# Patient Record
Sex: Male | Born: 1935 | Race: White | Hispanic: No | Marital: Married | Smoking: Former smoker
Health system: Southern US, Community
[De-identification: ages and names within clinical notes are randomized; demographics above are authoritative.]

## PROBLEM LIST (undated history)

## (undated) DIAGNOSIS — R569 Unspecified convulsions: Secondary | ICD-10-CM

## (undated) DIAGNOSIS — C801 Malignant (primary) neoplasm, unspecified: Secondary | ICD-10-CM

## (undated) DIAGNOSIS — M5136 Other intervertebral disc degeneration, lumbar region: Secondary | ICD-10-CM

## (undated) DIAGNOSIS — H3530 Unspecified macular degeneration: Secondary | ICD-10-CM

## (undated) DIAGNOSIS — I10 Essential (primary) hypertension: Secondary | ICD-10-CM

## (undated) DIAGNOSIS — M51369 Other intervertebral disc degeneration, lumbar region without mention of lumbar back pain or lower extremity pain: Secondary | ICD-10-CM

## (undated) HISTORY — PX: EYE SURGERY: SHX253

## (undated) HISTORY — PX: OTHER SURGICAL HISTORY: SHX169

## (undated) HISTORY — PX: COLOSTOMY: SHX63

## (undated) HISTORY — PX: TONSILLECTOMY: SUR1361

## (undated) HISTORY — PX: COLON SURGERY: SHX602

---

## 1997-11-26 ENCOUNTER — Other Ambulatory Visit: Admission: RE | Admit: 1997-11-26 | Discharge: 1997-11-26 | Payer: Self-pay | Admitting: Urology

## 1998-03-22 ENCOUNTER — Ambulatory Visit (HOSPITAL_COMMUNITY): Admission: RE | Admit: 1998-03-22 | Discharge: 1998-03-22 | Payer: Self-pay | Admitting: Gastroenterology

## 1998-11-25 ENCOUNTER — Other Ambulatory Visit: Admission: RE | Admit: 1998-11-25 | Discharge: 1998-11-25 | Payer: Self-pay | Admitting: Urology

## 1998-12-20 ENCOUNTER — Encounter: Admission: RE | Admit: 1998-12-20 | Discharge: 1999-03-20 | Payer: Self-pay | Admitting: Radiation Oncology

## 1998-12-31 ENCOUNTER — Encounter: Admission: RE | Admit: 1998-12-31 | Discharge: 1999-03-31 | Payer: Self-pay | Admitting: Radiation Oncology

## 1999-03-07 ENCOUNTER — Ambulatory Visit (HOSPITAL_BASED_OUTPATIENT_CLINIC_OR_DEPARTMENT_OTHER): Admission: RE | Admit: 1999-03-07 | Discharge: 1999-03-07 | Payer: Self-pay | Admitting: Urology

## 1999-03-07 ENCOUNTER — Encounter: Payer: Self-pay | Admitting: Urology

## 1999-03-08 ENCOUNTER — Emergency Department (HOSPITAL_COMMUNITY): Admission: EM | Admit: 1999-03-08 | Discharge: 1999-03-08 | Payer: Self-pay | Admitting: Emergency Medicine

## 1999-03-10 ENCOUNTER — Emergency Department (HOSPITAL_COMMUNITY): Admission: EM | Admit: 1999-03-10 | Discharge: 1999-03-10 | Payer: Self-pay | Admitting: Emergency Medicine

## 1999-03-12 ENCOUNTER — Emergency Department (HOSPITAL_COMMUNITY): Admission: EM | Admit: 1999-03-12 | Discharge: 1999-03-12 | Payer: Self-pay | Admitting: Emergency Medicine

## 1999-04-04 ENCOUNTER — Ambulatory Visit (HOSPITAL_COMMUNITY): Admission: RE | Admit: 1999-04-04 | Discharge: 1999-04-04 | Payer: Self-pay | Admitting: Radiation Oncology

## 1999-04-04 ENCOUNTER — Encounter: Payer: Self-pay | Admitting: Radiation Oncology

## 2000-06-01 HISTORY — PX: INSERTION PROSTATE RADIATION SEED: SUR718

## 2000-08-12 ENCOUNTER — Ambulatory Visit (HOSPITAL_COMMUNITY): Admission: RE | Admit: 2000-08-12 | Discharge: 2000-08-12 | Payer: Self-pay | Admitting: Gastroenterology

## 2000-09-02 ENCOUNTER — Ambulatory Visit (HOSPITAL_COMMUNITY): Admission: RE | Admit: 2000-09-02 | Discharge: 2000-09-02 | Payer: Self-pay | Admitting: Gastroenterology

## 2000-09-02 ENCOUNTER — Encounter: Payer: Self-pay | Admitting: Gastroenterology

## 2000-09-09 ENCOUNTER — Encounter: Payer: Self-pay | Admitting: Gastroenterology

## 2000-09-09 ENCOUNTER — Ambulatory Visit (HOSPITAL_COMMUNITY): Admission: RE | Admit: 2000-09-09 | Discharge: 2000-09-09 | Payer: Self-pay | Admitting: Gastroenterology

## 2000-09-20 ENCOUNTER — Ambulatory Visit: Admission: RE | Admit: 2000-09-20 | Discharge: 2000-12-19 | Payer: Self-pay | Admitting: Urology

## 2000-10-25 ENCOUNTER — Encounter: Payer: Self-pay | Admitting: Emergency Medicine

## 2000-10-25 ENCOUNTER — Emergency Department (HOSPITAL_COMMUNITY): Admission: EM | Admit: 2000-10-25 | Discharge: 2000-10-25 | Payer: Self-pay | Admitting: Emergency Medicine

## 2000-11-03 ENCOUNTER — Ambulatory Visit (HOSPITAL_COMMUNITY): Admission: RE | Admit: 2000-11-03 | Discharge: 2000-11-03 | Payer: Self-pay | Admitting: Gastroenterology

## 2000-11-03 ENCOUNTER — Encounter: Payer: Self-pay | Admitting: Gastroenterology

## 2000-11-05 ENCOUNTER — Emergency Department (HOSPITAL_COMMUNITY): Admission: EM | Admit: 2000-11-05 | Discharge: 2000-11-05 | Payer: Self-pay | Admitting: Emergency Medicine

## 2000-11-27 ENCOUNTER — Emergency Department (HOSPITAL_COMMUNITY): Admission: EM | Admit: 2000-11-27 | Discharge: 2000-11-27 | Payer: Self-pay | Admitting: Emergency Medicine

## 2000-12-07 ENCOUNTER — Encounter: Payer: Self-pay | Admitting: Gastroenterology

## 2000-12-07 ENCOUNTER — Ambulatory Visit (HOSPITAL_COMMUNITY): Admission: RE | Admit: 2000-12-07 | Discharge: 2000-12-07 | Payer: Self-pay | Admitting: Gastroenterology

## 2000-12-10 ENCOUNTER — Ambulatory Visit (HOSPITAL_COMMUNITY): Admission: RE | Admit: 2000-12-10 | Discharge: 2000-12-10 | Payer: Self-pay | Admitting: Gastroenterology

## 2000-12-10 ENCOUNTER — Encounter (INDEPENDENT_AMBULATORY_CARE_PROVIDER_SITE_OTHER): Payer: Self-pay | Admitting: Specialist

## 2000-12-31 ENCOUNTER — Ambulatory Visit (HOSPITAL_COMMUNITY): Admission: RE | Admit: 2000-12-31 | Discharge: 2000-12-31 | Payer: Self-pay | Admitting: Radiation Oncology

## 2001-01-03 ENCOUNTER — Ambulatory Visit (HOSPITAL_COMMUNITY): Admission: RE | Admit: 2001-01-03 | Discharge: 2001-01-03 | Payer: Self-pay | Admitting: Pulmonary Disease

## 2001-01-03 ENCOUNTER — Encounter: Payer: Self-pay | Admitting: Pulmonary Disease

## 2001-01-04 ENCOUNTER — Ambulatory Visit (HOSPITAL_COMMUNITY): Admission: RE | Admit: 2001-01-04 | Discharge: 2001-01-04 | Payer: Self-pay | Admitting: Radiation Oncology

## 2001-01-04 ENCOUNTER — Encounter: Payer: Self-pay | Admitting: Radiation Oncology

## 2001-03-09 ENCOUNTER — Ambulatory Visit (HOSPITAL_COMMUNITY): Admission: RE | Admit: 2001-03-09 | Discharge: 2001-03-09 | Payer: Self-pay | Admitting: Gastroenterology

## 2001-03-30 ENCOUNTER — Inpatient Hospital Stay (HOSPITAL_COMMUNITY): Admission: RE | Admit: 2001-03-30 | Discharge: 2001-04-04 | Payer: Self-pay | Admitting: General Surgery

## 2002-02-20 ENCOUNTER — Encounter: Payer: Self-pay | Admitting: Family Medicine

## 2002-02-20 ENCOUNTER — Encounter: Admission: RE | Admit: 2002-02-20 | Discharge: 2002-02-20 | Payer: Self-pay | Admitting: Family Medicine

## 2002-06-12 ENCOUNTER — Ambulatory Visit (HOSPITAL_COMMUNITY): Admission: RE | Admit: 2002-06-12 | Discharge: 2002-06-12 | Payer: Self-pay | Admitting: Gastroenterology

## 2003-01-04 ENCOUNTER — Ambulatory Visit (HOSPITAL_COMMUNITY): Admission: RE | Admit: 2003-01-04 | Discharge: 2003-01-04 | Payer: Self-pay | Admitting: Urology

## 2003-02-14 ENCOUNTER — Ambulatory Visit (HOSPITAL_COMMUNITY): Admission: RE | Admit: 2003-02-14 | Discharge: 2003-02-14 | Payer: Self-pay | Admitting: Gastroenterology

## 2003-04-05 ENCOUNTER — Encounter: Admission: RE | Admit: 2003-04-05 | Discharge: 2003-04-05 | Payer: Self-pay | Admitting: Family Medicine

## 2004-07-09 ENCOUNTER — Ambulatory Visit (HOSPITAL_COMMUNITY): Admission: RE | Admit: 2004-07-09 | Discharge: 2004-07-09 | Payer: Self-pay | Admitting: Urology

## 2007-06-02 HISTORY — PX: JOINT REPLACEMENT: SHX530

## 2007-10-10 ENCOUNTER — Inpatient Hospital Stay (HOSPITAL_COMMUNITY): Admission: RE | Admit: 2007-10-10 | Discharge: 2007-10-13 | Payer: Self-pay | Admitting: Orthopedic Surgery

## 2010-10-14 NOTE — H&P (Signed)
[name], [name]               ACCOUNT NO.:  [id number]   MEDICAL RECORD NO.:  [id number]          PATIENT TYPE:  INP   LOCATION:  NA                           FACILITY:  [facility]   PHYSICIAN:  Ollen Gross, M.D.    DATE OF BIRTH:  December 14, 1927   DATE OF ADMISSION:  10/10/2007  DATE OF DISCHARGE:                              HISTORY & PHYSICAL   CHIEF COMPLAINT:  Right knee pain.   HISTORY OF PRESENT ILLNESS:  The patient is a 77 year old male who has  seen by Dr. Lequita Halt for ongoing right knee pain.  He has known end-stage  arthritis and has been treated conservatively in the past including  injections and Synvisc.  Despite conservative measures he continues to  have pain.  It is felt he would benefit from undergoing surgical  intervention.  Risks and benefits discussed.  The patient was  subsequently admitted to the hospital.   ALLERGIES:  ACCUPRIL causes lip swelling.   CURRENT MEDICATIONS:  1. Dilantin.  2. Phenobarb.  3. Metoprolol.  4. Norvasc.  5. ICaps.   PAST MEDICAL HISTORY:  1. History of seizure approximately 30 years ago.  2. Left eye macular degeneration.  3. Cataracts.  4. Mild hypertension.  5. Mild hypercholesterolemia/  6. History of prostate cancer, status post seeding.  7. Childhood illnesses to include measles and mumps.   PAST SURGICAL HISTORY:  1. Colostomy due to ulcerations caused by prostate seeding.  2. Right knee scope about 10-12 years ago.  3. Tonsils.  4. Excision of several skin lesions.   SOCIAL HISTORY:  Married, retired.  Past smoker.  About 2 ounces of  alcohol daily.  Five children.  His wife will be assisting with care  after surgery.  Plans to go home postop.   FAMILY HISTORY:  Father with history of stroke.  Mother with history of  cancer.  Brother with a history of leukemia.   REVIEW OF SYSTEMS:  GENERAL:  No fevers, chills or night sweats.  NEURO:  History of seizure about 30 years ago.  No recent seizures, syncope,  or  paralysis.  RESPIRATORY:  No shortness breath, productive cough or  hemoptysis.  CARDIOVASCULAR: No chest pain or orthopnea.  GI: No nausea,  vomiting, diarrhea, or constipation.  He does have a colostomy.  GU:  Little bit of nocturia.  No dysuria, hematuria.  MUSCULOSKELETAL:  Right  knee__________ .   PHYSICAL EXAMINATION:  VITAL SIGNS:  Pulse 60, respirations 12, blood  pressure 132/64.  GENERAL:  A 77 year old white male well-nourished, well-developed, no  acute distress.  Alert, oriented, and cooperative.  HEENT: Normocephalic, atraumatic.  Pupils are round and reactive.  EOMs  intact.  Upper denture plate with a partial lower plate.  NECK:  Supple.  Chest: Clear.  HEART:  Regular rate and rhythm.  No murmur.  S1, S2.  ABDOMEN:  Soft, nontender.  Bowel sounds present.  RECTAL, BREASTS, GENITALIA:  not done and not pertinent to present  illness.  EXTREMITIES:  Right knee:  No effusion.  Range of motion 5-120, marked  crepitus, no instability.  IMPRESSION:  1. Osteoarthritis right knee.  2. History of seizure approximately 30 years ago.  3. Left eye macular degeneration.  4. History of cataracts.  5. Mild hypertension.  6. Mild hypercholesterolemia.  7. History of prostate cancer.   PLAN:  The patient was admitted to [facility] to undergo a  right total knee replacement arthroplasty.  Surgery will be performed by  Dr. Ollen Gross.      [name], P.A.C.      Ollen Gross, M.D.  Electronically Signed    ALP/MEDQ  D:  10/09/2007  T:  10/09/2007  Job:  [id number]   cc:   Ollen Gross, M.D.  Fax: [phone]   Chales Salmon. Abigail Miyamoto, M.D.  Fax: [phone]   Chart

## 2010-10-14 NOTE — Op Note (Signed)
[name], [name]               ACCOUNT NO.:  [id number]   MEDICAL RECORD NO.:  [id number]          PATIENT TYPE:  INP   LOCATION:  0005                         FACILITY:  [facility]   PHYSICIAN:  Ollen Gross, M.D.    DATE OF BIRTH:  March 13, 1928   DATE OF PROCEDURE:  10/10/2007  DATE OF DISCHARGE:                               OPERATIVE REPORT   PREOPERATIVE DIAGNOSIS:  Osteoarthritis, right knee.   POSTOPERATIVE DIAGNOSIS:  Osteoarthritis, right knee.   PROCEDURE:  Right total knee arthroplasty.   SURGEON:  Ollen Gross, M.D.   ASSISTANT:  Jamelle Rushing, P.A.   ANESTHESIA:  Spinal with Duramorph.   ESTIMATED BLOOD LOSS:  Minimal.   DRAINS:  None.   TOURNIQUET TIME:  35 minutes at 300 mmHg.   COMPLICATIONS:  None.   CONDITION:  Stable to recovery room.   CLINICAL NOTE:  [name] is a 77 year old male with end stage  arthritis of the right knee with progressive worsening pain and  dysfunction.  He has failed nonoperative management and presents for  total knee arthroplasty.   PROCEDURE IN DETAIL:  After the successful administration of spinal  anesthetic, a tourniquet was placed high on the right thigh and the  right lower extremity was prepped and draped in the usual sterile  fashion.  The extremity was wrapped in an Esmarch, the knee flexed, and  tourniquet inflated to 300 mmHg.  A midline incision was made a #10  blade through subcutaneous tissue to the level of the extensor  mechanism.  A fresh blade was used make a medial parapatellar  arthrotomy.  Soft tissue of the proximal medial tibia was  subperiosteally elevated to the joint line with the knife into the  semimembranosus bursa with a Cobb elevator.  Soft tissue laterally was  elevated with attention being paid to avoiding the patellar tendon with  the tibial tubercle.  The patella was subluxed laterally, the knee  flexed to 90 degrees, ACL and PCL were removed.  A drill was used create  a starting hole in  the distal femur.  The canal was thoroughly  irrigated.  A 5-degree right valgus alignment guide was placed  referencing off the posterior condyles.  Rotations were marked and the  block pinned to remove 10 mm off the distal femur.  Distal femoral  resection was made with an oscillating saw.  Sizing blocks were placed  and size 5 was most appropriate.  Rotation was marked at the epicondylar  axis.  Size 5 cutting blocks placed and the anterior, posterior, and  chamfer cuts made.   The tibia was subluxed forward and the menisci are removed.  Extramedullary tibial alignment guide was placed, referencing proximally  at the medial aspect of the tibial tubercle and distally along the  second metatarsal axis and tibial crest.  The block was pinned to remove  10 mm off the non-deficient lateral side.  Tibial resection was made an  oscillating saw.  A size 5 was also the most appropriate tibial  component and the proximal tibia was prepared with the modular drill and  keel punch for a size 5.  Femoral preparation was completed with an  intercondylar cut.   A size 5 mobile-bearing tibial trial, a size 5 posterior stabilized  femoral trial, and 10 mm posterior stabilized rotating platform insert  trial were placed.  With the 10, full extension was achieved with  excellent varus and valgus anterior and posterior balance throughout  full range of motion.  The patella was everted, thickness measured to be  24 mm.  Freehand resection was taken to 12 mm, a 41 template was placed,  lug holes were drilled, trial patella was placed and it tracked  normally.  The trials were removed and then the osteophytes removed from  the posterior femur with the femoral trial in place.  The femoral trials  were removed and any cut bone surfaces were thoroughly irrigated with  pulsatile lavage.  Cement was mixed and once ready for implantation, the  size 5 mobile-bearing tibial tray, size 5 posterior stabilized  femur,  and 41 patella were cemented into place and the patella was held with a  clamp.  The trial 10 mm inserts were placed, the knee held in full  extension, and all extruded cement removed.  Once the cement was fully  hardened, then the wound was copiously irrigated with saline solution  and FloSeal injected in the posterior capsule.  A 10 ml posterior  stabilized rotating platform insert was placed into the tibial tray.  The FloSeal was injected in the medial and lateral gutters and  suprapatellar area.  A moist sponge was placed and a tourniquet released  for a total time of 35 minutes.  The sponge was held for about two  minutes and then removed.  Minimal bleeding was encountered.  That which  was encountered was stopped with electrocautery.  Further irrigation was  then performed and the extensor mechanism closed with running #2 Quill  suture.  Flexion against gravity to 135 degrees.  The subcutaneous was  closed with interrupted 2-0 Vicryl and subcuticular running 4-0  Monocryl.  The incisions were cleaned and dried, and Steri-Strips and a  bulky sterile dressing were applied.  He was then placed into a knee  immobilizer, awakened, and transported to recovery in stable condition.      Ollen Gross, M.D.  Electronically Signed     FA/MEDQ  D:  10/10/2007  T:  10/10/2007  Job:  [id number]

## 2010-10-17 NOTE — Op Note (Signed)
NAME:  [name], [name]                         ACCOUNT NO.:  [id number]   MEDICAL RECORD NO.:  [id number]                   PATIENT TYPE:  AMB   LOCATION:  ENDO                                 FACILITY:  [facility]   PHYSICIAN:  Petra Kuba, M.D.                 DATE OF BIRTH:  07-Nov-1927   DATE OF PROCEDURE:  06/12/2002  DATE OF DISCHARGE:                                 OPERATIVE REPORT   PROCEDURE:  Flexible sigmoidoscopy.   INDICATIONS FOR PROCEDURE:  Rectal ulcer, recheck.  No sedation was given.   DESCRIPTION OF PROCEDURE:  Rectal inspection was pertinent for external  hemorrhoids. Digital examination was negative. Pediatric colonoscope was  inserted. The rectal ulcer seemed much better, was about 90% healed.  Photodocumentation was obtained. The scope was inserted to 20 cm. There was  friability of the rectum, although, no initial underlying abnormality. It  did seem to advance to the end of the Hartman's pouch. The scope was slowly  withdrawn. Again, there was some scarring still in the rectum. Did not seem  to have any active ulcer, just an old diverticular remnant and again looked  90% better. The scope was not retroflexed. The scope was slowly withdrawn.  The patient tolerated the procedure well. There was no obvious immediate  complications.   ENDOSCOPIC ASSESSMENT:  1. Internal and external hemorrhoids.  2. Ulcer significantly healed, actually not an actual ulcer, just a     diverticulum now.  3. Some rectal friability, but otherwise negative to 20 cm.   PLAN:  I would continue to allow passive healing as we are doing. I would  not send him down to [facility] and subjective him to the cost and time of the  hyperbaric, let alone whatever possible complications may exist. I would  recheck a flexible sigmoidoscopy in 6 to 12 months if he continues to want  things reversed. I have explained that to him and his wife and as long as he  is doing fine, we may not want to  subjective him to another operation and  the risks of that, although, possibly a one-time retrial, would be  reasonable, but I would wait 6 to 12 months. Happy to see back p.r.n.  Otherwise return care to above mentioned doctors.                                               Petra Kuba, M.D.    MEM/MEDQ  D:  06/12/2002  T:  06/12/2002  Job:  [id number]   cc:   Dellis Anes. Idell Pickles, M.D.  [address]  [place]  [place] [address]  Fax: [phone]   Gita Kudo, M.D.  [address]  [place]  [place] [address]  Fax:  [phone]   Wynn Banker, M.D.  [address] [facility]  [place]  [place]  [address]  Fax: [phone]

## 2010-10-17 NOTE — Discharge Summary (Signed)
NAMEYOBANY, VROOM               ACCOUNT NO.:  [id number]   MEDICAL RECORD NO.:  [id number]          PATIENT TYPE:  INP   LOCATION:  1603                         FACILITY:  [facility]   PHYSICIAN:  Ollen Gross, M.D.    DATE OF BIRTH:  1928-05-29   DATE OF ADMISSION:  10/10/2007  DATE OF DISCHARGE:  10/13/2007                               DISCHARGE SUMMARY   ADMISSION DIAGNOSES:  1. Osteoarthritis right knee.  2. History of seizure approximately 30 years ago.  3. Left eye macular degeneration.  4. History of cataracts.  5. Mild hypertension.  6. Mild hypercholesterolemia.  7. History of prostate cancer.   DISCHARGE DIAGNOSES:  1. Osteoarthritis right knee status post right total knee replacement      arthroplasty.  2. Mild postop blood loss anemia.  3. Mild postop hyponatremia.  4. History of seizure approximately 30 years ago.  5. Left eye macular degeneration.  6. History of cataracts.  7. Mild hypertension.  8. Mild hypercholesterolemia.  9. History of prostate cancer.   PROCEDURE:  Oct 10, 2007, right total knee.  Surgeon:  Dr. Lequita Halt.  Assistant:  Arlyn Leak, PA-C.  Surgery performed under spinal  anesthesia with Duramorph added.   CONSULTS:  None.   BRIEF HISTORY:  Mr. Spieler is a 79-year male with end-stage arthritis of  the right knee, progressive worsening pain and dysfunction, failed  nonoperative management, now presents for total knee arthroplasty.   LABORATORY DATA:  Preop CBC showed hemoglobin 11.5, hematocrit 33.6,  white cell count 5.4, platelets 165.  Postop hemoglobin 10, drifted down  to 8.9, stabilized at 8.8 with hematocrit at 25.6.  PT/PTT preop 12.6  and 28, respectively.  INR 0.9.  Serial protimes followed.  Last noted  PT/INR 32.3 and 3.0.  Chem panel on admission all within normal limits.  Serial BMETs were followed.  Electrolytes remained within normal limits  with exception of drop in sodium from 137 to 132, last noted at 130,  glucose did  go up from 75 up to 139, back down to 103.  Preop UA  negative.  Blood group type A+.   Two-view chest preop Oct 04, 2007, no active lung disease, mild  cardiomegaly.   HOSPITAL COURSE:  The patient admitted to [facility],  tolerated procedure well, later transferred to the recovery room and  orthopedic floor, started on PCA and p.o. analgesics for pain control  following surgery, given 24 hours postop IV antibiotics.  Actually doing  pretty well on the morning of day #1, started getting up out of bed with  therapy.  Resumed home meds had a little bit of low output so we gave  gentle fluids to help with urinary output.  Did walk about 20 feet.  By  day #2, was doing a little bit better, using the PCA.  Encouraged p.o.  meds and discontinued the PCA a little bit later that day.  Had a little  drop in sodium, felt to be dilutional, although started to diurese  fluids well.  Dressing changed incision looked good, slowly progressing  with therapy although  by day #3, the patient was walking about 175 feet.  The patient was diuresing fluids well, although slow sodium was still a  little bit low, 130, felt to be due to the positive volume status;  asymptomatic with this.  Hemoglobin stabilized at 8.8, but the patient  was also asymptomatic with the anemia.  Recommended iron, tolerated  therapy and was discharged home.   DISCHARGE PLAN:  1. Patient discharged home on Oct 13, 2007.  2. Discharge diagnoses:  Please see above.  3. Discharge medications:  Nu-Iron, Percocet, Robaxin, Coumadin.  4. Diet:  Heart-healthy diet.  5. Daily dressing change.  6. Follow-up in 2 weeks.   DISPOSITION:  Home.   CONDITION ON DISCHARGE:  Improving.      Alexzandrew L. Perkins, P.A.C.      Ollen Gross, M.D.  Electronically Signed    ALP/MEDQ  D:  11/16/2007  T:  11/16/2007  Job:  [id number]   cc:   Chales Salmon. Abigail Miyamoto, M.D.  Fax: [phone]

## 2010-10-17 NOTE — Op Note (Signed)
NAME:  [name], [name]                         ACCOUNT NO.:  [id number]   MEDICAL RECORD NO.:  [id number]                   PATIENT TYPE:  AMB   LOCATION:  ENDO                                 FACILITY:  [facility]   PHYSICIAN:  Petra Kuba, M.D.                 DATE OF BIRTH:  [birth year]   DATE OF PROCEDURE:  02/14/2003  DATE OF DISCHARGE:                                 OPERATIVE REPORT   PROCEDURE:  Flexible sigmoidoscopy and colonoscopy.   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  Consent was signed after risks, benefits,  and options were thoroughly discussed in the office on multiple occasions.   MEDICATIONS USED:  Demerol 80 mg and Versed  8 mg.   PROCEDURE:  Rectal inspection was pertinent for external hemorrhoids.  Digital exam was negative.  The video pediatric adjustable colonoscope was  inserted and the previous seen rectal ulcer site was seen.  It was healed  nicely, but did have a moderate size diverticula but no active ulceration.  We did advance to 20 cm, no blood was seen on insertion nor any friability  or any other problems.  However, on slow withdrawal, there was some  friability.  In advancing to 20 cm, a small diverticulum was seen.  We came  to a tortuous turn at 20 cm and elected not to advance any further not  wanting to disrupt the Hartman's pouch, possibly the anastomosis was right  behind the curve, elected not to retroflex.  Again, the previous ulcer site  was washed and mucous was withdrawn from it.  Although it was clearly  evident where the ulcer was before, it was all healed and probably  granulated as much as it is going to.  No other abnormalities were seen.  The scope was removed.  I went ahead and inserted this scope through the  ostomy site.  The ostomy appeared normal.  Unfortunately, there was a  tortuous turn at about 30 cm and we elected to withdraw the scope.  I  reinserted the upper endoscope which was easily able to advance to the  cecum.  On insertion, possibly a tiny polyp was seen and was insignificant  looking, probably hyperplastic, and a few scattered diverticula.  To advance  to the cecum did require some right upper quadrant pressure.  The cecum was  identified by the appendiceal orifice and the ileocecal valve, in fact, the  scope was inserted a short ways into the terminal ileum which was normal.  Photodocumentation was obtained.  The scope was slowly withdrawn.  The prep  was adequate.  On slow withdrawal back through the ostomy, the polyp seen on  insertion was not found, but no other polypoid lesions, signs of bleeding,  or other abnormalities  but a rare diverticula were seen.  We did reinsert  the scope along the transverse twice to try to find the tiny polyp seen on  insertion but could not.  We elected to withdraw.  Once through the ostomy,  the patient tolerated the procedure well.  There was no obvious immediate  immediate complications.   ENDOSCOPIC DIAGNOSIS:  1. Healed rectal ulcer but scarring with a moderate size diverticula.  2. Rare sigmoid diverticula.  3. Otherwise, within normal limits to 20 cm except for some friability.  4. Colonoscopy via the ostomy was done to the terminal ileum.  5. Questionable tiny insignificant polyp seen on insertion not found on     withdrawal.  6. Rare left sided diverticula seen.  7. Otherwise, within normal limits.    PLAN:  I would ask him to follow up with both Drs. [place] and [place] who  know him well to decide if reanastomosis is a good idea.  I would be happy  to assist p.r.n.  Otherwise, be on standby to help and if doing well  medically, recheck colon screening in five years.                                               Petra Kuba, M.D.    MEM/MEDQ  D:  02/14/2003  T:  02/14/2003  Job:  [id number]    cc:   Dellis Anes. Idell Pickles, M.D.  [address]  [place]  [place] [address]  Fax: [phone]

## 2010-10-17 NOTE — Procedures (Signed)
[place]  Patient:    [name], [name]                      MRN: [id number] Proc. Date: 12/10/00 Adm. Date:  [id number] Attending:  Nelda Marseille CC:         Dellis Anes. Idell Pickles, M.D.  Wynn Banker, M.D.  Gita Kudo, M.D.  Claudette Laws, M.D.   Procedure Report  PROCEDURE:  Flexible sigmoidoscopy with biopsy.  ENDOSCOPIST:  Petra Kuba, M.D.  INDICATIONS:  Patient with increasing rectal pain.  History of radiation proctitis.  Consent was signed after risks, benefits, methods, and options were thoroughly discussed.  MEDICINES USED:  Demerol 70, Versed 7.  DESCRIPTION OF PROCEDURE:  Rectal inspection was pertinent for small external hemorrhoids.  Digital exam was pertinent for a mass-like lesion and some discomfort despite the sedation.  Pediatric video colonoscope was inserted, and a large necrotic rectal ulcer was seen just above the hemorrhoidal line. No other rectal abnormality was seen.  The scope was inserted to about 25 cm. Some diverticula was seen, and there was some tortuosity, and we elected to withdraw.  Anorectal pull through confirmed the ulceration.  The area was washed and watched.  We went a head and took two biopsies of the edge of the ulcer carefully not to disturb the base.  We did go ahead and retroflex which confirmed the ulcerations and its location just above the hemorrhoidal line. The scope was straightened.  Air was withdrawn and scope removed.  The patient tolerated the procedure well.  There was no obvious immediate complication.  ENDOSCOPIC DIAGNOSIS: 1. External hemorrhoid. 2. Abnormal rectal exam. 3. Large, deep, necrotic ulcer just above hemorrhoidal line status post    biopsies of the edge x 2. 4. Left diverticula with exam to 25 cm only secondary to tortuosity.  PLAN:  Await pathology.  I think the options are either a diverting colostomy or university second opinion, possibly  hyperbaric oxygen.  Will discuss with Dr. Etta Grandchild and certainly, because of his insurance, Dr. Idell Pickles will have to okay the next step.DD:  12/10/00 TD:  12/10/00 Job: [id number]

## 2010-10-17 NOTE — Op Note (Signed)
[place]. [facility]  Patient:    [name], [name] Visit Number: [id number] MRN: [id number]          Service Type: SUR Location: [id number] Attending Physician:  Tempie Donning Dictated by:   Gita Kudo, M.D. Proc. Date: 03/30/01 Admit Date:  03/30/2001   CC:         Dellis Anes. Idell Pickles, M.D.  Petra Kuba, M.D.  Claudette Laws, M.D.  Wynn Banker, M.D.   Operative Report  PREOPERATIVE DIAGNOSIS:  Rectal ulcer, radiation proctitis.  POSTOPERATIVE DIAGNOSIS:  Rectal ulcer, radiation proctitis.  OPERATION PERFORMED:  Sigmoid end colostomy.  SURGEON:  Gita Kudo, M.D.  ASSISTANT:  Rose Phi. Maple Hudson, M.D.  ANESTHESIA:  General endotracheal.  INDICATIONS FOR PROCEDURE:  The patient is a 77 year old male with a large anterior anorectal ulcer.  [name].  He has had radiation treatment for prostate cancer.  He has had a several month history of severe anorectal pain.  His treatment was approximately two years ago.  He has seen Drs. Magod,  Goodchild, Heller and Sural.  He has had work-up showing the ulcer.  Also had a biopsy showing it was benign.  He has been seen at [facility] by the colorectal surgeons and also has had approximately  80 hyperbaric oxygen treatments.  His symptoms continue, especially with bowel movements and he is requiring a fair amount of analgesics.  After consultations, a colostomy is suggested and accepted.  OPERATIVE FINDINGS:  The patients abdominal examination was basically negative.  His large and small bowel felt normal.  He did have significant calcifications in his great vessels including aorta and both iliacs.  The pelvis did not appear to be abnormal.  I did not see any definitely abnormal small or large bowel.  DESCRIPTION OF PROCEDURE:  Under satisfactory general endotracheal, having received Cefotan IV preop, his abdomen was prepped and  draped in a standard fashion.  A spot was selected for the colostomy preoperatively and marked. Then a midline incision was made into the peritoneum.  Careful examination revealed no other pathology.  A site of the distal sigmoid was selected and the bowel divided with a GIA.  Mesentery divided between clamps and ties of 2-0 silk.  The lateral reflection of the colon was also incised to allow good mobility. Then the previously marked skin area was excised as a disk with a small amount of subcutaneous fat.  The rectus fascia was incised in a cruciate type fashion and the rectus muscles split and not divided. The peritoneal cavity was then entered and a 2 finger dilatation was performed.  The end of the sigmoid was then brought out. The sigmoid was attached with three 3-0 silk sutures to the abdominal peritoneum.  Likewise the divided mesentery was also anchored to the abdominal wall to avoid an internal hernia with interrupted 3-0 silk sutures down to the transected distal stump.  Then 3-0 silk sutures were used x 2 in the subcutaneous approximating the serosa to the fascia.  Following this, the abdomen was then closed with a running #1 PDS midline closure and staples used to approximate the skin.  The staple line was then cut and the end of the sigmoid colon matured into a colostomy with interrupted #3-0 chromic suture.  An ostomy appliance was then placed.  Dressings applied. The patient then went to the recovery room from the operating room in good condition  without complications. Dictated by:   Gita Kudo, M.D. Attending Physician:  Tempie Donning DD:  03/30/01 TD:  03/30/01 Job: [id number]

## 2010-10-17 NOTE — Procedures (Signed)
[facility]  Patient:    [name], [name]                         MRN: [id number] Proc. Date: 08/12/00 Attending:  Petra Kuba, M.D. CC:         Dellis Anes. Idell Pickles, M.D.  Claudette Laws, M.D.  Wynn Banker, M.D.   Procedure Report  PROCEDURE:  Colonoscopy with biopsy and Erbe argon plasma coagulator.  INDICATIONS:  Bright red blood per rectum in a patient with a history of colon polyps, due this fall for a repeat screening.  INFORMED CONSENT:  Consent was signed after risk, benefits, methods and options were thoroughly discussed multiple times in the past.  MEDICINES USED:  Demerol 80 mg, Versed 8.5 mg.  DESCRIPTION OF PROCEDURE:  Rectal inspection is pertinent for external hemorrhoids.  Digital examination was negative.  The video colonoscope was inserted, and immediately inside the rectum a moderate sized probably radiation ulcer was seen with some telangiectatic and friable areas around it. The scope was retroflexed in the rectum which revealed some internal hemorrhoids but no anal abnormality otherwise.  The scope was straightened and with minimal difficulty due to some diverticula in the sigmoid in a tortuous colon, was able to be advanced to the cecum on insertion other than a left-sided diverticula.  No additional findings were seen.  To advance in the cecum required rolling him on his back and some abdominal pressure.  The cecum was identified by the appendiceal orifice and the ileocecal valve.  Prep was adequate.  There was some liquid stool that required washing and suctioning on slow withdrawal through the colon.  The cecum, ascending, transverse, and majority of the left side were normal.  There were some moderate amount of sigmoid diverticula.  No polypoid lesions were seen.  The scope was slowly withdrawal back to the rectum and anal rectal pull-through confirmed the above findings.  We went ahead and took some biopsies around the  radiation ulcer, washed the area well, suctioned all the water and then proceeded with multiple blasts from the Erbe argon plasma coagulator until almost complete obliteration of all telangiectatic areas were seen.  Further documentation was obtained.  There was no active bleeding.  The area was washed then watched without ______ complication or problem.  The air was suctioned, the scope removed.  The patient tolerated the procedure well.  There was no obvious immediate complication endoscopic.  ENDOSCOPIC DIAGNOSIS: 1. Internal and external hemorrhoids. 2. Rectal ulcer, status post biopsy with some radiation damage and    friability on telangiectasia around it, status post argon Erbe plasma    coagulator at the end of the procedure to all areas involved. 3. Left-sided diverticula. 4. Otherwise within normal limits to the cecum without any obvious polyps    seen.  PLAN:  Await pathology,  Rowasa suppositories once a day, could to up to twice a day if they are helpful.  Followup p.r.n. or in six weeks to recheck symptoms, probably guaiac and make sure no other further work-up plans are needed and probably repeat colonic screening in three to five years based on prep and tortuosity. DD:  08/12/00 TD:  08/13/00 Job: [id number]

## 2010-10-17 NOTE — Procedures (Signed)
[place]. [facility]  Patient:    [name], [name] Visit Number: [id number] MRN: [id number]          Service Type: END Location: ENDO Attending Physician:  Nelda Marseille Dictated by:   Petra Kuba, M.D. Proc. Date: 03/09/01 Admit Date:  03/09/2001   CC:         Wynn Banker, M.D.  Claudette Laws, M.D.  Dellis Anes Idell Pickles, M.D.  Gita Kudo, M.D.  Dr. Waynetta Sandy, [facility] Hyperbaric Oxygen Department   Procedure Report  PROCEDURE:  Flexible sigmoidoscopy.  INDICATION:  Want to recheck rectal ulcer.  Consent was signed after risks, benefits, methods, and options thoroughly discussed on multiple occasions.  MEDICATIONS:  Demerol 25 mg, Versed 10 mg.  DESCRIPTION OF PROCEDURE:  Rectal inspection was pertinent for small external hemorrhoids.  Digital exam is pertinent for an obvious mass-like lesion, which was slightly tender.  Pediatric video colonoscope was inserted, and an obvious ulcer seen.  It was possibly not quite as deep and possibly a touch smaller than before.  There was no mass lesion associated with it.  It was just a moderately large ulcer at the anorectal junction.  The scope was inserted to 25 cm.  No other abnormalities were seen.  At that juncture a tight turn was seen, and we could not easily advance around it.  We elected to slowly withdraw.  Anorectal pull-through confirmed the above.  The scope was retroflexed, which again confirmed the ulcer and some small internal hemorrhoids.  Photo documentation was obtained and a copy given to the patient to show other doctors.  Air was suctioned, scope removed.  The patient tolerated the procedure well.  There was no obvious immediate complication.  ENDOSCOPIC DIAGNOSES: 1. Internal-external small hemorrhoids. 2. Still a large anorectal ulcer with a flat white base, no obvious mass. 3. Abnormal rectal exam compatible with the ulcer. 4. Otherwise negative exam to 25 cm, stopped  secondary to a tortuous turn.  PLAN:  Per Dr. Waynetta Sandy, questionable further hyperbaric oxygen versus consideration of a diverting colostomy, although the patient is about 80% better.  We may want to just follow him clinically.  Happy to see back p.r.n. Please let me know if I can assist further at this time. Dictated by:   Petra Kuba, M.D. Attending Physician:  Nelda Marseille DD:  03/09/01 TD:  03/10/01 Job: [id number]

## 2010-10-17 NOTE — Op Note (Signed)
   NAME:  DELWIN, RACZKOWSKI                         ACCOUNT NO.:  [id number]   MEDICAL RECORD NO.:  [id number]                   PATIENT TYPE:  AMB   LOCATION:  DAY                                  FACILITY:  [facility]   PHYSICIAN:  Claudette Laws, M.D.               DATE OF BIRTH:  Apr 21, 1928   DATE OF PROCEDURE:  01/04/2003  DATE OF DISCHARGE:                                 OPERATIVE REPORT   PREOPERATIVE DIAGNOSIS:  Symptomatic left hydrocele.   POSTOPERATIVE DIAGNOSIS:  Symptomatic left hydrocele.   OPERATION/PROCEDURE:  Left hydrocelectomy.   SURGEON:  Claudette Laws, M.D.   DESCRIPTION OF PROCEDURE:  The patient was prepped and draped in the in the  supine position using sterile technique under LMA anesthesia.  A vessel-  splitting incision was made over the left hemiscrotum.  Dissection was  carried down to the tunica vaginalis and hydrocele sac which was delivered  through the incision.  The sac was then opened.  Approximately 200 mL of  clear yellow fluid was aspirated.  The testicle itself looked normal, normal  epididymis.   I then inverted the hydrocele sac and closed it in a bottle-type fashion  using 3-0 Vicryl.  Then using 20 mL of 0.25% plain Marcaine the scrotum and  cord were blocked.  The wound was irrigated out with copious amounts of  saline.  The testicle was then dropped back to its normal position, care  being taken to cause no torsion.  The scrotum was then closed in two layers  with a 3-0 Vicryl and then 4-0 Monocryl subcuticular stitch followed by  Dermabond on the skin, and a fluff dressing and a scrotal support.  The  patient was then taken back to the recovery room in satisfactory condition.                                               Claudette Laws, M.D.    RFS/MEDQ  D:  01/04/2003  T:  01/04/2003  Job:  [id number]

## 2010-10-17 NOTE — Discharge Summary (Signed)
[place]. [facility]  Patient:    [name] Visit Number: [id number] MRN: [id number]          Service Type: SUR Location: 6700 [id number] Attending Physician:  Tempie Donning Dictated by:   Gita Kudo, M.D. Admit Date:  03/30/2001 Discharge Date: 04/04/2001   CC:         Petra Kuba, M.D. -- Foye Deer, M.D. ([facility])             Wynn Banker, M.D.  Claudette Laws, M.D.   Discharge Summary  CHIEF COMPLAINT:  Painful rectal ulcer.  HISTORY OF PRESENT ILLNESS:  77 year old male who had radiation treatment for prostatic cancer.  He has developed an ulcer in his rectum that is quite painful. This has been visualized and biopsied on colonoscopy and I saw this on anoscopic examination and could easily feel this on rectal examination.  He has been to [facility] and received multiple treatments of hyperbaric oxygen. Although his symptoms have improved somewhat, they have certainly not all disappeared.  Actually in the past week, following the HBO, his symptoms have worsened.  LABORATORY AND ACCESSORY DATA:  Hemoglobin 10.4, hematocrit 31, postoperative 11 and 32, sodium slightly low at 132.  EKG did not show any active abnormality.  Chest x-ray did not show any active disease.  HOSPITAL COURSE:  The patient had a bowel prep at home.  He underwent sigmoid colostomy on the morning of admission without any complications. Postoperatively he did quite well. He regained bowel function.  His Foley catheter was discontinued.  He was instructed in care of the stoma.  He continued to improve and was learning stoma care when he was discharged on his fifth postoperative day.  DISCHARGE INSTRUCTIONS:  He was allowed home on regular diet, wound care, continue previous medications (he has been on antihypertensives), follow up in the office.  DISCHARGE DIAGNOSIS:  Rectal ulcer post-radiation treatment for  prostate cancer.  OPERATIONS:  March 30, 2001, sigmoid colostomy.  COMPLICATIONS:  Infections none.  CONSULTATIONS:  Medical hospital service for hypertension.  CONDITION ON DISCHARGE:  Good. Dictated by:   Gita Kudo, M.D. Attending Physician:  Tempie Donning DD:  05/02/01 TD:  05/03/01 Job: [id number]

## 2010-10-17 NOTE — H&P (Signed)
[place]. [facility]  Patient:    [name], [name] Visit Number: [id number] MRN: [id number]          Service Type: SUR Location: 6700 [id number] [id number] Attending Physician:  Tempie Donning Dictated by:   Gita Kudo, M.D. Admit Date:  03/30/2001                           History and Physical  CHIEF COMPLAINT:  Rectal ulcer.  HISTORY OF PRESENT ILLNESS:  Mr. Pickerill had adenocarcinoma of the prostate, treated by radiation seed, beginning in July 2000.  He tolerated the procedure well, but afterwards began having pain.  Approximately eight to 10 months ago he was examined and found to have an anterior ulcer.  He has had a colonoscopy, physical examination, and treatment with hyperbaric oxygen.  He has had a consultation with Dr. Petra Kuba, as well as at [facility] with their colorectal service.  He has not really improved after conservative therapy and HBO.  A colostomy is felt indicated.  PAST MEDICAL HISTORY:  Basically negative except for the carcinoma of the prostate.  He does have epilepsy that is well-controlled, and has not had seizures for years.  For that he takes Dilantin and phenobarbital.  He is currently on analgesic patches.  PAST SURGICAL HISTORY: He has had orthopedic surgery, including knee surgery.  ALLERGIES:  No known drug allergies.  REVIEW OF SYSTEMS:  Negative.  FAMILY HISTORY:  Negative, noncontributory.  SOCIAL HISTORY:  Negative, noncontributory.  PHYSICAL EXAMINATION:  GENERAL:  Alert, lean, muscular male, in no distress.  VITAL SIGNS:  Temperature 99 degrees, pulse 72, respirations 16 blood pressure 150/80, weight 172 pounds.  HEENT:  Head normal.  ENT:  No obstruction or infection.  HEART:  Regular, without murmur.  ABDOMEN:  Soft.  There is a mark in the left upper quadrant from the antral stomal therapist for colostomy placement.  EXTREMITIES:  He has no deformity or edema.  RECTAL:  Deferred, has  been done recently.  IMPRESSION:  Solitary rectal ulcer, for a colostomy. Dictated by:   Gita Kudo, M.D. Attending Physician:  Tempie Donning DD:  03/30/01 TD:  03/30/01 Job: [id number]

## 2011-02-25 LAB — CBC
HCT: 25.9 — ABNORMAL LOW
Hemoglobin: 11.5 — ABNORMAL LOW
MCHC: 34.4
MCHC: 34.4
MCV: 100.8 — ABNORMAL HIGH
MCV: 101 — ABNORMAL HIGH
MCV: 101.6 — ABNORMAL HIGH
Platelets: 148 — ABNORMAL LOW
Platelets: 151
RBC: 3.31 — ABNORMAL LOW
RDW: 14.4
RDW: 14.4
RDW: 14.4
WBC: 5.4
WBC: 5.7

## 2011-02-25 LAB — COMPREHENSIVE METABOLIC PANEL
ALT: 21
AST: 27
Alkaline Phosphatase: 61
CO2: 25
Chloride: 105
GFR calc Af Amer: >60
GFR calc non Af Amer: >60
Sodium: 137
Total Bilirubin: 0.7

## 2011-02-25 LAB — BASIC METABOLIC PANEL
BUN: 12
BUN: 19
BUN: 9
CO2: 26
Calcium: 8.9
Chloride: 102
Creatinine, Ser: 1.05
Creatinine, Ser: 1.08
GFR calc non Af Amer: 59 — ABNORMAL LOW
GFR calc non Af Amer: >60
Glucose, Bld: 103 — ABNORMAL HIGH
Glucose, Bld: 135 — ABNORMAL HIGH
Potassium: 4.3
Potassium: 4.3
Sodium: 136

## 2011-02-25 LAB — ABO/RH: ABO/RH(D): A POS

## 2011-02-25 LAB — PROTIME-INR
INR: 1
INR: 3 — ABNORMAL HIGH
Prothrombin Time: 13.7
Prothrombin Time: 20.1 — ABNORMAL HIGH
Prothrombin Time: 32.3 — ABNORMAL HIGH

## 2011-02-25 LAB — URINALYSIS, ROUTINE W REFLEX MICROSCOPIC
Bilirubin Urine: NEGATIVE
Glucose, UA: NEGATIVE
Hgb urine dipstick: NEGATIVE
Ketones, ur: NEGATIVE
pH: 6.5

## 2011-02-25 LAB — TYPE AND SCREEN

## 2011-08-07 ENCOUNTER — Emergency Department (HOSPITAL_COMMUNITY)
Admission: EM | Admit: 2011-08-07 | Discharge: 2011-08-07 | Disposition: A | Discharge status: Home / Self Care | Payer: Medicare Other | Attending: Emergency Medicine | Admitting: Emergency Medicine

## 2011-08-07 ENCOUNTER — Encounter (HOSPITAL_COMMUNITY): Payer: Self-pay | Admitting: Emergency Medicine

## 2011-08-07 ENCOUNTER — Other Ambulatory Visit: Payer: Self-pay

## 2011-08-07 DIAGNOSIS — I10 Essential (primary) hypertension: Secondary | ICD-10-CM | POA: Insufficient documentation

## 2011-08-07 DIAGNOSIS — G40909 Epilepsy, unspecified, not intractable, without status epilepticus: Secondary | ICD-10-CM | POA: Insufficient documentation

## 2011-08-07 DIAGNOSIS — R5381 Other malaise: Secondary | ICD-10-CM | POA: Insufficient documentation

## 2011-08-07 DIAGNOSIS — R42 Dizziness and giddiness: Secondary | ICD-10-CM | POA: Insufficient documentation

## 2011-08-07 DIAGNOSIS — Z79899 Other long term (current) drug therapy: Secondary | ICD-10-CM | POA: Insufficient documentation

## 2011-08-07 HISTORY — DX: Unspecified convulsions: R56.9

## 2011-08-07 HISTORY — DX: Essential (primary) hypertension: I10

## 2011-08-07 LAB — POCT I-STAT, CHEM 8
Creatinine, Ser: 1 mg/dL (ref 0.50–1.35)
Glucose, Bld: 95 mg/dL (ref 70–99)
HCT: 32 % — ABNORMAL LOW (ref 39.0–52.0)
Hemoglobin: 10.9 g/dL — ABNORMAL LOW (ref 13.0–17.0)
Potassium: 4.2 mEq/L (ref 3.5–5.1)
Sodium: 136 mEq/L (ref 135–145)
TCO2: 24 mmol/L (ref 0–100)

## 2011-08-07 MED ORDER — ONDANSETRON HCL 4 MG/2ML IJ SOLN
INTRAMUSCULAR | Status: AC
  Administered 2011-08-07: 12:00:00
  Filled 2011-08-07: qty 2

## 2011-08-07 NOTE — ED Notes (Signed)
Pt has similar episode 1 week ago while in [place] for family funeral. Did get treatment at hospital and pt was monitored and discharged with dx of dehydration.

## 2011-08-07 NOTE — Discharge Instructions (Signed)

## 2011-08-07 NOTE — ED Provider Notes (Signed)
History     CSN: [id number]  Arrival date & time 08/07/11  1133   First MD Initiated Contact with Patient 08/07/11 1254      Chief Complaint  Patient presents with  . Weakness  . Dizziness    started about 1 hour ago after eating   . Nausea     HPI Pt has similar episode 1 week ago while in [place] for family funeral. Did get treatment at hospital and pt was monitored and discharged with dx of dehydration.  Patient denies chest pain, headache, shortness of breath.  Dizziness has resolved.  Some aspects of vertigo present.  Patient had recent MRI of the brain along with carotid and cardiac Dopplers.  Similar episode in [place] last week.  Patient does admit to having insomnia and depression over the last few days.  Past Medical History  Diagnosis Date  . Seizure   . Hypertension     Past Surgical History  Procedure Date  . Colostomy     No family history on file.  History  Substance Use Topics  . Smoking status: Not on file  . Smokeless tobacco: Not on file  . Alcohol Use:       Review of Systems Negative except as noted in history of present illness Allergies  Review of patient's allergies indicates no known allergies.  Home Medications   Current Outpatient Rx  Name Route Sig Dispense Refill  . AMLODIPINE BESYLATE 10 MG PO TABS Oral Take 10 mg by mouth daily.    Marland Kitchen METOPROLOL TARTRATE 25 MG PO TABS Oral Take by mouth 2 (two) times daily.    . ICAPS PO Oral Take 1 tablet by mouth daily.    Marland Kitchen NAPROXEN SODIUM 220 MG PO TABS Oral Take 220 mg by mouth 2 (two) times daily with a meal.    . PHENOBARBITAL 32.4 MG PO TABS Oral Take 64.8 mg by mouth 2 (two) times daily.    Marland Kitchen PHENYTOIN SODIUM EXTENDED 100 MG PO CAPS Oral Take 200 mg by mouth 2 (two) times daily.     Marland Kitchen VITAMIN B-12 1000 MCG PO TABS Oral Take 1,000 mcg by mouth daily.      BP 160/90  Pulse 70  Temp(Src) 97.6 F (36.4 C) (Oral)  Resp 14  SpO2 99%  Physical Exam  Nursing note and vitals  reviewed. Constitutional: He is oriented to person, place, and time. He appears well-developed and well-nourished. No distress.  HENT:  Head: Normocephalic and atraumatic.  Eyes: Pupils are equal, round, and reactive to light.  Neck: Normal range of motion.  Cardiovascular: Normal rate and intact distal pulses.        Evaluation of the IVC with bedside ultrasound shows no evidence of volume depletion or dehydration    Date: 08/07/2011  Rate: 58  Rhythm: normal sinus rhythm  QRS Axis: normal  Intervals: normal  ST/T Wave abnormalities: normal  Conduction Disutrbances: none  Narrative Interpretation:    Pulmonary/Chest: No respiratory distress.  Abdominal: Normal appearance. He exhibits no distension. There is no tenderness. There is no rebound and no guarding.  Musculoskeletal: Normal range of motion.  Neurological: He is alert and oriented to person, place, and time. No cranial nerve deficit.  Skin: Skin is warm and dry. No rash noted.  Psychiatric: He has a normal mood and affect. His behavior is normal.    ED Course  Procedures (including critical care time)  Labs Reviewed  POCT I-STAT, CHEM 8 - Abnormal; Notable  for the following:    BUN 27 (*)    Hemoglobin 10.9 (*)    HCT 32.0 (*)    All other components within normal limits  POCT I-STAT TROPONIN I   No results found.   1. Dizziness       MDM          Nelia Shi, MD 08/07/11 1444

## 2011-08-07 NOTE — ED Notes (Signed)
ZOX:[id number]<AV> Expected date:<BR> Expected time:11:31 AM<BR> Means of arrival:<BR> Comments:<BR> M62 AMS, hypotensive

## 2011-08-19 ENCOUNTER — Other Ambulatory Visit: Payer: Self-pay | Admitting: Dermatology

## 2012-03-02 ENCOUNTER — Other Ambulatory Visit: Payer: Self-pay | Admitting: Dermatology

## 2012-04-07 ENCOUNTER — Other Ambulatory Visit: Payer: Self-pay | Admitting: Dermatology

## 2012-05-19 ENCOUNTER — Other Ambulatory Visit: Payer: Self-pay | Admitting: Dermatology

## 2012-06-23 ENCOUNTER — Other Ambulatory Visit: Payer: Self-pay | Admitting: Dermatology

## 2012-09-21 ENCOUNTER — Other Ambulatory Visit: Payer: Self-pay | Admitting: Dermatology

## 2013-01-30 DIAGNOSIS — C801 Malignant (primary) neoplasm, unspecified: Secondary | ICD-10-CM

## 2013-01-30 HISTORY — PX: MOHS SURGERY: SUR867

## 2013-01-30 HISTORY — DX: Malignant (primary) neoplasm, unspecified: C80.1

## 2013-04-20 ENCOUNTER — Other Ambulatory Visit: Payer: Self-pay | Admitting: Orthopedic Surgery

## 2013-04-20 NOTE — Progress Notes (Signed)
Preoperative surgical orders have been place into the Epic hospital system for [name] on 04/20/2013, 10:52 PM  by Patrica Duel for surgery on 05/08/13.  Preop Total Knee orders including Experal, PO Tylenol, and IV Decadron as long as there are no contraindications to the above medications. Avel Peace, PA-C

## 2013-04-28 ENCOUNTER — Encounter (HOSPITAL_COMMUNITY): Payer: Self-pay | Admitting: Pharmacy Technician

## 2013-05-02 ENCOUNTER — Other Ambulatory Visit (HOSPITAL_COMMUNITY): Payer: Self-pay | Admitting: Orthopedic Surgery

## 2013-05-02 NOTE — Progress Notes (Signed)
Clearance Dr Tenny Craw on chart

## 2013-05-02 NOTE — Patient Instructions (Addendum)
20 [name]  05/02/2013   Your procedure is scheduled on:  05/08/13  MONDAY  Report to [facility] at  0940     AM.  Call this number if you have problems the morning of surgery: [phone]       Remember:   Do not eat food  After Midnight. Sunday NIGHT-----MAY HAVE CLEAR LIQUIDS Monday MORNING UNTIL 0630-- THEN NOTHING BY MOUTH   Take these medicines the morning of surgery with A SIP OF WATER: Amlodipine, METOPROLOL, Phenobarbital, Dilantin   .  Contacts, dentures or partial plates can not be worn to surgery  Leave suitcase in the car. After surgery it may be brought to your room.  For patients admitted to the hospital, checkout time is 11:00 AM day of  discharge.             SPECIAL INSTRUCTIONS- SEE [place] PREPARING FOR SURGERY INSTRUCTION SHEET-     DO NOT WEAR JEWELRY, LOTIONS, POWDERS, OR PERFUMES.  WOMEN-- DO NOT SHAVE LEGS OR UNDERARMS FOR 12 HOURS BEFORE SHOWERS. MEN MAY SHAVE FACE.  Patients discharged the day of surgery will not be allowed to drive home. IF going home the day of surgery, you must have a driver and someone to stay with you for the first 24 hours  Name and phone number of your driver:     ADMISSION                                                                   Please read over the following fact sheets that you were given: MRSA Information, Incentive Spirometry Sheet, Blood Transfusion Sheet  Information                                                                                   [name]  PST 336  [phone]                 FAILURE TO FOLLOW THESE INSTRUCTIONS MAY RESULT IN  CANCELLATION   OF YOUR SURGERY                                                  Patient Signature _____________________________

## 2013-05-03 ENCOUNTER — Ambulatory Visit (HOSPITAL_COMMUNITY)
Admission: RE | Admit: 2013-05-03 | Discharge: 2013-05-03 | Disposition: A | Discharge status: Home / Self Care | Payer: Medicare Other | Source: Ambulatory Visit | Attending: Orthopedic Surgery | Admitting: Orthopedic Surgery

## 2013-05-03 ENCOUNTER — Encounter (HOSPITAL_COMMUNITY): Payer: Self-pay

## 2013-05-03 ENCOUNTER — Encounter (HOSPITAL_COMMUNITY)
Admission: RE | Admit: 2013-05-03 | Discharge: 2013-05-03 | Disposition: A | Discharge status: Home / Self Care | Payer: Medicare Other | Source: Ambulatory Visit | Attending: Orthopedic Surgery | Admitting: Orthopedic Surgery

## 2013-05-03 DIAGNOSIS — M5380 Other specified dorsopathies, site unspecified: Secondary | ICD-10-CM | POA: Insufficient documentation

## 2013-05-03 DIAGNOSIS — Z01812 Encounter for preprocedural laboratory examination: Secondary | ICD-10-CM | POA: Insufficient documentation

## 2013-05-03 DIAGNOSIS — Z01818 Encounter for other preprocedural examination: Secondary | ICD-10-CM | POA: Insufficient documentation

## 2013-05-03 DIAGNOSIS — Z01810 Encounter for preprocedural cardiovascular examination: Secondary | ICD-10-CM | POA: Insufficient documentation

## 2013-05-03 DIAGNOSIS — M1710 Unilateral primary osteoarthritis, unspecified knee: Secondary | ICD-10-CM | POA: Insufficient documentation

## 2013-05-03 DIAGNOSIS — I10 Essential (primary) hypertension: Secondary | ICD-10-CM | POA: Insufficient documentation

## 2013-05-03 HISTORY — DX: Other intervertebral disc degeneration, lumbar region: M51.36

## 2013-05-03 HISTORY — DX: Unspecified macular degeneration: H35.30

## 2013-05-03 HISTORY — DX: Other intervertebral disc degeneration, lumbar region without mention of lumbar back pain or lower extremity pain: M51.369

## 2013-05-03 HISTORY — DX: Malignant (primary) neoplasm, unspecified: C80.1

## 2013-05-03 LAB — COMPREHENSIVE METABOLIC PANEL
ALT: 11 U/L (ref 0–53)
AST: 22 U/L (ref 0–37)
Albumin: 3.9 g/dL (ref 3.5–5.2)
Alkaline Phosphatase: 96 U/L (ref 39–117)
BUN: 40 mg/dL — ABNORMAL HIGH (ref 6–23)
Calcium: 9.2 mg/dL (ref 8.4–10.5)
Creatinine, Ser: 1.14 mg/dL (ref 0.50–1.35)
GFR calc Af Amer: 67 mL/min — ABNORMAL LOW (ref >90)
Glucose, Bld: 96 mg/dL (ref 70–99)
Potassium: 4.8 mEq/L (ref 3.5–5.1)
Total Bilirubin: 0.3 mg/dL (ref 0.3–1.2)
Total Protein: 7.1 g/dL (ref 6.0–8.3)

## 2013-05-03 LAB — URINALYSIS, ROUTINE W REFLEX MICROSCOPIC
Bilirubin Urine: NEGATIVE
Glucose, UA: NEGATIVE mg/dL
Hgb urine dipstick: NEGATIVE
Protein, ur: NEGATIVE mg/dL
Specific Gravity, Urine: 1.026 (ref 1.005–1.030)

## 2013-05-03 LAB — SURGICAL PCR SCREEN
MRSA, PCR: NEGATIVE
Staphylococcus aureus: NEGATIVE

## 2013-05-03 LAB — PROTIME-INR
INR: 0.99 (ref 0.00–1.49)
Prothrombin Time: 12.9 seconds (ref 11.6–15.2)

## 2013-05-03 LAB — CBC
HCT: 32 % — ABNORMAL LOW (ref 39.0–52.0)
Hemoglobin: 11 g/dL — ABNORMAL LOW (ref 13.0–17.0)
MCH: 34.5 pg — ABNORMAL HIGH (ref 26.0–34.0)
MCHC: 34.4 g/dL (ref 30.0–36.0)
MCV: 100.3 fL — ABNORMAL HIGH (ref 78.0–100.0)
RDW: 14.5 % (ref 11.5–15.5)

## 2013-05-03 LAB — APTT: aPTT: 32 seconds (ref 24–37)

## 2013-05-03 NOTE — Progress Notes (Signed)
CBC, CMET faxed to Dr Lequita Halt through [facility]

## 2013-05-07 ENCOUNTER — Other Ambulatory Visit: Payer: Self-pay | Admitting: Orthopedic Surgery

## 2013-05-07 NOTE — H&P (Signed)
[name]  DOB: 12/20/1927 Married / Language: English / Race: White Male  Date of Admission:  05-08-2013  Chief Complaint:  Left Knee Pain  History of Present Illness The patient is a 79 year old male who comes in for a preoperative History and Physical. The patient is scheduled for a left total knee arthroplasty to be performed by Dr. Frank V. Aluisio, MD at [facility] on 05/08/2013. The patient is a 79 year old male who presents today for follow up of their knee. The patient is being followed for their left knee pain and osteoarthritis. They are now 1 year(s) (9 months) out from the last visit. Symptoms reported today include: pain. The patient feels that they are doing poorly and report their pain level to be moderate. Current treatment includes: Glucosamine. The patient is now about 5 years out from right total knee arthroplasty. The patient states that he is doing well at this time. The pain is under excellent control at this time and describe their pain as mild. They are currently on no medication for their pain. The patient is currently doing home exercise program. The patient feels that they are progressing well at this time. The left knee is now the probelm and he is now ready to proceed with left total knee repalcement. They have been treated conservatively in the past for the above stated problem and despite conservative measures, they continue to have progressive pain and severe functional limitations and dysfunction. They have failed non-operative management including home exercise, medications. It is felt that they would benefit from undergoing total joint replacement. Risks and benefits of the procedure have been discussed with the patient and they elect to proceed with surgery. There are no active contraindications to surgery such as ongoing infection or rapidly progressive neurological disease.   Problem List/Past Medical Osteoarthritis, Hip  (715.35) S/P total knee replacement (V43.65) Primary osteoarthritis of one knee (715.16) Prostate Cancer Prostate Disease Seizure Disorder. last episode was about 35 years ago High blood pressure Osteoarthrosis NOS, lower leg (715.96). 03/20/2005 Measles Mumps Colonic Ulcerations   Allergies No Known Drug Allergies. 03/04/2011   Family History Cancer. First Degree Relatives. mother, father and brother   Social History Copy of Drug/Alcohol Rehab (Previously). no Children. 5 or more Alcohol use. current drinker; drinks hard liquor; 5-7 per week Marital status. married Current work status. retired Drug/Alcohol Rehab (Currently). no Illicit drug use. no Living situation. live with spouse Exercise. Exercises weekly; does other Tobacco use. former smoker; smoke(d) less than 1/2 pack(s) per day; uses less than half 1/2 can(s) smokeless per week Number of flights of stairs before winded. greater than 5 Pain Contract. no Tobacco / smoke exposure. no Post-Surgical Plans. Home Advance Directives. Living Will, Healthcare POA   Medication History PHENobarbital (30MG Tablet, Oral) Active. Dilantin (100MG Capsule, Oral) Active. eye caps Active. Metoprolol Tartrate (25MG Tablet, Oral) Active. AmLODIPine Besylate (5MG Tablet, Oral) Active. Naprosyn (250MG Tablet, 1 Oral as needed) Active.    Past Surgical History Sinus Surgery Straighten Nasal Septum Cataract Surgery. bilateral Tonsillectomy  Review of Systems General:Not Present- Chills, Fever, Night Sweats, Fatigue, Weight Gain, Weight Loss and Memory Loss. Skin:Not Present- Hives, Itching, Rash, Eczema and Lesions. HEENT:Not Present- Tinnitus, Headache, Double Vision, Visual Loss, Hearing Loss and Dentures. Respiratory:Not Present- Shortness of breath with exertion, Shortness of breath at rest, Allergies, Coughing up blood and Chronic Cough. Cardiovascular:Not Present- Chest Pain,  Racing/skipping heartbeats, Difficulty Breathing Lying Down, Murmur, Swelling and Palpitations. Gastrointestinal:Not Present- Bloody   Stool, Heartburn, Abdominal Pain, Vomiting, Nausea, Constipation, Diarrhea, Difficulty Swallowing, Jaundice and Loss of appetitie. Male Genitourinary:Present- Urinating at Night. Not Present- Urinary frequency, Blood in Urine, Weak urinary stream, Discharge, Flank Pain, Incontinence, Painful Urination, Urgency and Urinary Retention. Musculoskeletal:Present- Joint Pain. Not Present- Muscle Weakness, Muscle Pain, Joint Swelling, Back Pain, Morning Stiffness and Spasms. Neurological:Not Present- Tremor, Dizziness, Blackout spells, Paralysis, Difficulty with balance and Weakness. Psychiatric:Not Present- Insomnia.    Vitals Weight: 185 lb Height: 71 in Body Surface Area: 2.05 m Body Mass Index: 25.8 kg/m Pulse: 76 (Regular) Resp.: 16 (Unlabored) BP: 138/70 (Sitting, Left Arm, Standard)     Physical Exam The physical exam findings are as follows:   General Mental Status - Alert, cooperative and good historian. General Appearance- pleasant. Not in acute distress. Orientation- Oriented X3. Build & Nutrition- Well nourished and Well developed.   Head and Neck Head- normocephalic, atraumatic . Neck Global Assessment- supple. no bruit auscultated on the right and no bruit auscultated on the left.   Eye Pupil- Bilateral- Regular and Round. Motion- Bilateral- EOMI.   Chest and Lung Exam Auscultation: Breath sounds:- clear at anterior chest wall and - clear at posterior chest wall. Adventitious sounds:- No Adventitious sounds.   Cardiovascular Auscultation:Rhythm- Regular rate and rhythm. Heart Sounds- S1 WNL and S2 WNL. Murmurs & Other Heart Sounds:Auscultation of the heart reveals - No Murmurs.   Abdomen Inspection:Umbilicus- Note: colostomy bag just left of the umbilicus, left lower quad of  abdomen Palpation/Percussion:Tenderness- Abdomen is non-tender to palpation. Rigidity (guarding)- Abdomen is soft. Auscultation:Auscultation of the abdomen reveals - Bowel sounds normal.   Male Genitourinary Not done, not pertinent to present illness  Musculoskeletal On exam well developed male, alert and oriented in no apparent distress. His right knee looks great. There is no swelling. Range 0 to 130 with no tenderness or instability. The left knee varus deformity, marked crepitus on range of motion. Tender medial greater than lateral with no instability noted.  RADIOGRAPHS: AP both knees and lateral show the prosthesis on the right in excellent position. No periprosthetic abnormalities. On the left he is bone on bone medial and patellofemoral.   Assessment & Plan Primary osteoarthritis of one knee (715.16) Impression: Left Knee  Note: Plan is for a Left Total Knee Replacement by Dr. Aluisio.  Plan is to go home  PCP - Dr. C. A. Ross - Patient has been seen preoperatively and felt to be stable for surgery.  The patient will not receive TXA (tranexamic acid) due to: Prostate Cancer  Signed electronically by Donevan Biller L Jahsir Rama, III PA-C  

## 2013-05-08 ENCOUNTER — Encounter (HOSPITAL_COMMUNITY)
Admission: RE | Disposition: A | Discharge status: Skilled Nursing Facility | Payer: Self-pay | Source: Ambulatory Visit | Attending: Orthopedic Surgery

## 2013-05-08 ENCOUNTER — Encounter (HOSPITAL_COMMUNITY): Payer: Self-pay | Admitting: *Deleted

## 2013-05-08 ENCOUNTER — Encounter (HOSPITAL_COMMUNITY): Payer: Medicare Other | Admitting: Anesthesiology

## 2013-05-08 ENCOUNTER — Inpatient Hospital Stay (HOSPITAL_COMMUNITY)
Admission: RE | Admit: 2013-05-08 | Discharge: 2013-05-11 | DRG: 470 | Disposition: A | Discharge status: Skilled Nursing Facility | Payer: Medicare Other | Source: Ambulatory Visit | Attending: Orthopedic Surgery | Admitting: Orthopedic Surgery

## 2013-05-08 ENCOUNTER — Inpatient Hospital Stay (HOSPITAL_COMMUNITY): Payer: Medicare Other | Admitting: Anesthesiology

## 2013-05-08 DIAGNOSIS — Z79899 Other long term (current) drug therapy: Secondary | ICD-10-CM

## 2013-05-08 DIAGNOSIS — M1710 Unilateral primary osteoarthritis, unspecified knee: Principal | ICD-10-CM | POA: Diagnosis present

## 2013-05-08 DIAGNOSIS — N429 Disorder of prostate, unspecified: Secondary | ICD-10-CM | POA: Diagnosis present

## 2013-05-08 DIAGNOSIS — Z87891 Personal history of nicotine dependence: Secondary | ICD-10-CM

## 2013-05-08 DIAGNOSIS — H3530 Unspecified macular degeneration: Secondary | ICD-10-CM | POA: Diagnosis present

## 2013-05-08 DIAGNOSIS — I10 Essential (primary) hypertension: Secondary | ICD-10-CM | POA: Diagnosis present

## 2013-05-08 DIAGNOSIS — M179 Osteoarthritis of knee, unspecified: Secondary | ICD-10-CM

## 2013-05-08 DIAGNOSIS — Z9289 Personal history of other medical treatment: Secondary | ICD-10-CM

## 2013-05-08 DIAGNOSIS — M169 Osteoarthritis of hip, unspecified: Secondary | ICD-10-CM | POA: Diagnosis present

## 2013-05-08 DIAGNOSIS — M5137 Other intervertebral disc degeneration, lumbosacral region: Secondary | ICD-10-CM | POA: Diagnosis present

## 2013-05-08 DIAGNOSIS — Z96652 Presence of left artificial knee joint: Secondary | ICD-10-CM

## 2013-05-08 DIAGNOSIS — M51379 Other intervertebral disc degeneration, lumbosacral region without mention of lumbar back pain or lower extremity pain: Secondary | ICD-10-CM | POA: Diagnosis present

## 2013-05-08 DIAGNOSIS — M1610 Unilateral primary osteoarthritis, unspecified hip: Secondary | ICD-10-CM | POA: Diagnosis present

## 2013-05-08 DIAGNOSIS — D62 Acute posthemorrhagic anemia: Secondary | ICD-10-CM

## 2013-05-08 DIAGNOSIS — G40909 Epilepsy, unspecified, not intractable, without status epilepticus: Secondary | ICD-10-CM | POA: Diagnosis present

## 2013-05-08 DIAGNOSIS — Z85820 Personal history of malignant melanoma of skin: Secondary | ICD-10-CM

## 2013-05-08 DIAGNOSIS — E871 Hypo-osmolality and hyponatremia: Secondary | ICD-10-CM

## 2013-05-08 DIAGNOSIS — Z96659 Presence of unspecified artificial knee joint: Secondary | ICD-10-CM

## 2013-05-08 DIAGNOSIS — Z8546 Personal history of malignant neoplasm of prostate: Secondary | ICD-10-CM

## 2013-05-08 HISTORY — PX: TOTAL KNEE ARTHROPLASTY: SHX125

## 2013-05-08 SURGERY — ARTHROPLASTY, KNEE, TOTAL
Anesthesia: Spinal | Site: Knee | Laterality: Left

## 2013-05-08 MED ORDER — HYDROMORPHONE HCL PF 1 MG/ML IJ SOLN: 0.2500 mg | INTRAMUSCULAR | Status: DC | PRN

## 2013-05-08 MED ORDER — PROMETHAZINE HCL 25 MG/ML IJ SOLN: 6.2500 mg | INTRAMUSCULAR | Status: DC | PRN

## 2013-05-08 MED ORDER — ONDANSETRON HCL 4 MG/2ML IJ SOLN: 4.0000 mg | Freq: Four times a day (QID) | INTRAMUSCULAR | Status: DC | PRN

## 2013-05-08 MED ORDER — ONDANSETRON HCL 4 MG/2ML IJ SOLN
INTRAMUSCULAR | Status: DC | PRN
  Administered 2013-05-08: 4 mg via INTRAVENOUS

## 2013-05-08 MED ORDER — DEXAMETHASONE SODIUM PHOSPHATE 10 MG/ML IJ SOLN
10.0000 mg | Freq: Every day | INTRAMUSCULAR | Status: AC
  Filled 2013-05-08: qty 1

## 2013-05-08 MED ORDER — DEXAMETHASONE SODIUM PHOSPHATE 10 MG/ML IJ SOLN: 10.0000 mg | Freq: Once | INTRAMUSCULAR | Status: DC

## 2013-05-08 MED ORDER — POLYETHYLENE GLYCOL 3350 17 G PO PACK: 17.0000 g | PACK | Freq: Every day | ORAL | Status: DC | PRN

## 2013-05-08 MED ORDER — PROPOFOL 10 MG/ML IV BOLUS
INTRAVENOUS | Status: AC
  Filled 2013-05-08: qty 20

## 2013-05-08 MED ORDER — BUPIVACAINE HCL 0.25 % IJ SOLN
INTRAMUSCULAR | Status: DC | PRN
  Administered 2013-05-08: 20 mL

## 2013-05-08 MED ORDER — DOCUSATE SODIUM 100 MG PO CAPS
100.0000 mg | ORAL_CAPSULE | Freq: Two times a day (BID) | ORAL | Status: DC
  Administered 2013-05-09 – 2013-05-11 (×4): 100 mg via ORAL

## 2013-05-08 MED ORDER — PHENOL 1.4 % MT LIQD
1.0000 | OROMUCOSAL | Status: DC | PRN
  Filled 2013-05-08: qty 177

## 2013-05-08 MED ORDER — DIPHENHYDRAMINE HCL 12.5 MG/5ML PO ELIX
12.5000 mg | ORAL_SOLUTION | ORAL | Status: DC | PRN
  Administered 2013-05-10: 25 mg via ORAL
  Filled 2013-05-08: qty 10

## 2013-05-08 MED ORDER — METHOCARBAMOL 500 MG PO TABS
500.0000 mg | ORAL_TABLET | Freq: Four times a day (QID) | ORAL | Status: DC | PRN
  Administered 2013-05-09 – 2013-05-10 (×5): 500 mg via ORAL
  Filled 2013-05-08 (×6): qty 1

## 2013-05-08 MED ORDER — PHENYTOIN SODIUM EXTENDED 100 MG PO CAPS
200.0000 mg | ORAL_CAPSULE | Freq: Two times a day (BID) | ORAL | Status: DC
  Administered 2013-05-08 – 2013-05-11 (×6): 200 mg via ORAL
  Filled 2013-05-08 (×7): qty 2

## 2013-05-08 MED ORDER — DEXAMETHASONE 6 MG PO TABS
10.0000 mg | ORAL_TABLET | Freq: Every day | ORAL | Status: AC
  Administered 2013-05-09: 10 mg via ORAL
  Filled 2013-05-08: qty 1

## 2013-05-08 MED ORDER — METOPROLOL TARTRATE 25 MG PO TABS
25.0000 mg | ORAL_TABLET | Freq: Two times a day (BID) | ORAL | Status: DC
  Administered 2013-05-08 – 2013-05-11 (×6): 25 mg via ORAL
  Filled 2013-05-08 (×7): qty 1

## 2013-05-08 MED ORDER — ACETAMINOPHEN 325 MG PO TABS: 650.0000 mg | ORAL_TABLET | Freq: Four times a day (QID) | ORAL | Status: DC | PRN

## 2013-05-08 MED ORDER — FLEET ENEMA 7-19 GM/118ML RE ENEM: 1.0000 | ENEMA | Freq: Once | RECTAL | Status: AC | PRN

## 2013-05-08 MED ORDER — PROPOFOL INFUSION 10 MG/ML OPTIME
INTRAVENOUS | Status: DC | PRN
  Administered 2013-05-08: 100 ug/kg/min via INTRAVENOUS

## 2013-05-08 MED ORDER — BISACODYL 10 MG RE SUPP: 10.0000 mg | Freq: Every day | RECTAL | Status: DC | PRN

## 2013-05-08 MED ORDER — BUPIVACAINE LIPOSOME 1.3 % IJ SUSP
20.0000 mL | Freq: Once | INTRAMUSCULAR | Status: DC
  Filled 2013-05-08: qty 20

## 2013-05-08 MED ORDER — DEXTROSE-NACL 5-0.9 % IV SOLN
INTRAVENOUS | Status: DC
  Administered 2013-05-08: 100 mL/h via INTRAVENOUS
  Administered 2013-05-09: 02:00:00 via INTRAVENOUS
  Administered 2013-05-09: 50 mL/h via INTRAVENOUS

## 2013-05-08 MED ORDER — KETOROLAC TROMETHAMINE 15 MG/ML IJ SOLN
7.5000 mg | Freq: Four times a day (QID) | INTRAMUSCULAR | Status: AC | PRN
  Administered 2013-05-08 – 2013-05-09 (×2): 7.5 mg via INTRAVENOUS
  Filled 2013-05-08 (×2): qty 1

## 2013-05-08 MED ORDER — ACETAMINOPHEN 650 MG RE SUPP: 650.0000 mg | Freq: Four times a day (QID) | RECTAL | Status: DC | PRN

## 2013-05-08 MED ORDER — BUPIVACAINE IN DEXTROSE 0.75-8.25 % IT SOLN
INTRATHECAL | Status: DC | PRN
  Administered 2013-05-08: 1.8 mL via INTRATHECAL

## 2013-05-08 MED ORDER — LACTATED RINGERS IV SOLN
INTRAVENOUS | Status: DC | PRN
  Administered 2013-05-08 (×2): via INTRAVENOUS

## 2013-05-08 MED ORDER — BUPIVACAINE HCL (PF) 0.25 % IJ SOLN
INTRAMUSCULAR | Status: AC
  Filled 2013-05-08: qty 30

## 2013-05-08 MED ORDER — AMLODIPINE BESYLATE 5 MG PO TABS
5.0000 mg | ORAL_TABLET | Freq: Every day | ORAL | Status: DC
  Administered 2013-05-09 – 2013-05-11 (×3): 5 mg via ORAL
  Filled 2013-05-08 (×4): qty 1

## 2013-05-08 MED ORDER — ONDANSETRON HCL 4 MG PO TABS: 4.0000 mg | ORAL_TABLET | Freq: Four times a day (QID) | ORAL | Status: DC | PRN

## 2013-05-08 MED ORDER — METOCLOPRAMIDE HCL 5 MG/ML IJ SOLN: 5.0000 mg | Freq: Three times a day (TID) | INTRAMUSCULAR | Status: DC | PRN

## 2013-05-08 MED ORDER — SODIUM CHLORIDE 0.9 % IV SOLN: INTRAVENOUS | Status: DC

## 2013-05-08 MED ORDER — ONDANSETRON HCL 4 MG/2ML IJ SOLN
INTRAMUSCULAR | Status: AC
  Filled 2013-05-08: qty 2

## 2013-05-08 MED ORDER — CEFAZOLIN SODIUM-DEXTROSE 2-3 GM-% IV SOLR
INTRAVENOUS | Status: AC
  Filled 2013-05-08: qty 50

## 2013-05-08 MED ORDER — ENOXAPARIN SODIUM 30 MG/0.3ML ~~LOC~~ SOLN
30.0000 mg | Freq: Two times a day (BID) | SUBCUTANEOUS | Status: DC
  Administered 2013-05-09 – 2013-05-11 (×5): 30 mg via SUBCUTANEOUS
  Filled 2013-05-08 (×7): qty 0.3

## 2013-05-08 MED ORDER — SODIUM CHLORIDE 0.9 % IJ SOLN
INTRAMUSCULAR | Status: DC | PRN
  Administered 2013-05-08: 14:00:00

## 2013-05-08 MED ORDER — OXYCODONE HCL 5 MG PO TABS
5.0000 mg | ORAL_TABLET | ORAL | Status: DC | PRN
  Administered 2013-05-08: 20:00:00 10 mg via ORAL
  Administered 2013-05-08: 5 mg via ORAL
  Administered 2013-05-08 – 2013-05-11 (×11): 10 mg via ORAL
  Filled 2013-05-08 (×3): qty 2
  Filled 2013-05-08: qty 1
  Filled 2013-05-08 (×2): qty 2
  Filled 2013-05-08: qty 1
  Filled 2013-05-08 (×4): qty 2
  Filled 2013-05-08: qty 1
  Filled 2013-05-08 (×4): qty 2

## 2013-05-08 MED ORDER — CEFAZOLIN SODIUM-DEXTROSE 2-3 GM-% IV SOLR
2.0000 g | INTRAVENOUS | Status: AC
  Administered 2013-05-08: 2 g via INTRAVENOUS

## 2013-05-08 MED ORDER — METOCLOPRAMIDE HCL 10 MG PO TABS: 5.0000 mg | ORAL_TABLET | Freq: Three times a day (TID) | ORAL | Status: DC | PRN

## 2013-05-08 MED ORDER — MORPHINE SULFATE 2 MG/ML IJ SOLN
1.0000 mg | INTRAMUSCULAR | Status: DC | PRN
  Administered 2013-05-09: 1 mg via INTRAVENOUS
  Administered 2013-05-09: 01:00:00 2 mg via INTRAVENOUS
  Filled 2013-05-08 (×3): qty 1

## 2013-05-08 MED ORDER — KETAMINE HCL 50 MG/ML IJ SOLN
INTRAMUSCULAR | Status: DC | PRN
  Administered 2013-05-08 (×5): 10 mg via INTRAMUSCULAR

## 2013-05-08 MED ORDER — ACETAMINOPHEN 500 MG PO TABS
1000.0000 mg | ORAL_TABLET | Freq: Once | ORAL | Status: AC
  Administered 2013-05-08: 1000 mg via ORAL
  Filled 2013-05-08: qty 2

## 2013-05-08 MED ORDER — MENTHOL 3 MG MT LOZG
1.0000 | LOZENGE | OROMUCOSAL | Status: DC | PRN
  Filled 2013-05-08: qty 9

## 2013-05-08 MED ORDER — LIDOCAINE HCL (CARDIAC) 20 MG/ML IV SOLN
INTRAVENOUS | Status: AC
  Filled 2013-05-08: qty 5

## 2013-05-08 MED ORDER — METHOCARBAMOL 100 MG/ML IJ SOLN
500.0000 mg | Freq: Four times a day (QID) | INTRAVENOUS | Status: DC | PRN
  Administered 2013-05-08 (×2): 500 mg via INTRAVENOUS
  Filled 2013-05-08 (×2): qty 5

## 2013-05-08 MED ORDER — SODIUM CHLORIDE 0.9 % IJ SOLN
INTRAMUSCULAR | Status: AC
  Filled 2013-05-08: qty 50

## 2013-05-08 MED ORDER — PHENOBARBITAL 32.4 MG PO TABS
64.8000 mg | ORAL_TABLET | Freq: Two times a day (BID) | ORAL | Status: DC
  Administered 2013-05-08: 64.8 mg via ORAL
  Filled 2013-05-08: qty 1
  Filled 2013-05-08: qty 2

## 2013-05-08 MED ORDER — RIVAROXABAN 10 MG PO TABS: 10.0000 mg | ORAL_TABLET | Freq: Every day | ORAL | Status: DC

## 2013-05-08 MED ORDER — ACETAMINOPHEN 500 MG PO TABS
1000.0000 mg | ORAL_TABLET | Freq: Four times a day (QID) | ORAL | Status: AC
  Administered 2013-05-08 – 2013-05-09 (×3): 1000 mg via ORAL
  Filled 2013-05-08 (×4): qty 2

## 2013-05-08 MED ORDER — TRAMADOL HCL 50 MG PO TABS
50.0000 mg | ORAL_TABLET | Freq: Four times a day (QID) | ORAL | Status: DC | PRN
  Administered 2013-05-09 – 2013-05-10 (×2): 100 mg via ORAL
  Filled 2013-05-08 (×2): qty 2

## 2013-05-08 MED ORDER — CEFAZOLIN SODIUM-DEXTROSE 2-3 GM-% IV SOLR
2.0000 g | Freq: Four times a day (QID) | INTRAVENOUS | Status: AC
  Administered 2013-05-08 – 2013-05-09 (×2): 2 g via INTRAVENOUS
  Filled 2013-05-08 (×2): qty 50

## 2013-05-08 SURGICAL SUPPLY — 56 items
BAG SPEC THK2 15X12 ZIP CLS (MISCELLANEOUS)
BAG ZIPLOCK 12X15 (MISCELLANEOUS) ×1 IMPLANT
BANDAGE ELASTIC 6 VELCRO ST LF (GAUZE/BANDAGES/DRESSINGS) ×2 IMPLANT
BANDAGE ESMARK 6X9 LF (GAUZE/BANDAGES/DRESSINGS) ×1 IMPLANT
BLADE SAG 18X100X1.27 (BLADE) ×2 IMPLANT
BLADE SAW SGTL 11.0X1.19X90.0M (BLADE) ×2 IMPLANT
BNDG CMPR 9X6 STRL LF SNTH (GAUZE/BANDAGES/DRESSINGS) ×1
BNDG ESMARK 6X9 LF (GAUZE/BANDAGES/DRESSINGS) ×2
BOWL SMART MIX CTS (DISPOSABLE) ×2 IMPLANT
CAPT RP KNEE ×1 IMPLANT
CEMENT HV SMART SET (Cement) ×3 IMPLANT
CUFF TOURN SGL QUICK 34 (TOURNIQUET CUFF) ×2
CUFF TRNQT CYL 34X4X40X1 (TOURNIQUET CUFF) ×1 IMPLANT
DECANTER SPIKE VIAL GLASS SM (MISCELLANEOUS) ×2 IMPLANT
DRAPE EXTREMITY T 121X128X90 (DRAPE) ×2 IMPLANT
DRAPE POUCH INSTRU U-SHP 10X18 (DRAPES) ×2 IMPLANT
DRAPE U-SHAPE 47X51 STRL (DRAPES) ×2 IMPLANT
DRSG ADAPTIC 3X8 NADH LF (GAUZE/BANDAGES/DRESSINGS) ×2 IMPLANT
DURAPREP 26ML APPLICATOR (WOUND CARE) ×2 IMPLANT
ELECT REM PT RETURN 9FT ADLT (ELECTROSURGICAL) ×2
ELECTRODE REM PT RTRN 9FT ADLT (ELECTROSURGICAL) ×1 IMPLANT
EVACUATOR 1/8 PVC DRAIN (DRAIN) ×2 IMPLANT
FACESHIELD LNG OPTICON STERILE (SAFETY) ×10 IMPLANT
GLOVE BIO SURGEON STRL SZ7.5 (GLOVE) ×9 IMPLANT
GLOVE BIO SURGEON STRL SZ8 (GLOVE) ×2 IMPLANT
GLOVE BIOGEL PI IND STRL 8 (GLOVE) ×2 IMPLANT
GLOVE BIOGEL PI INDICATOR 8 (GLOVE) ×2
GLOVE SURG SS PI 6.5 STRL IVOR (GLOVE) IMPLANT
GOWN PREVENTION PLUS LG XLONG (DISPOSABLE) ×2 IMPLANT
GOWN STRL REIN XL XLG (GOWN DISPOSABLE) ×5 IMPLANT
HANDPIECE INTERPULSE COAX TIP (DISPOSABLE) ×2
IMMOBILIZER KNEE 20 (SOFTGOODS) ×2
IMMOBILIZER KNEE 20 THIGH 36 (SOFTGOODS) ×1 IMPLANT
KIT BASIN OR (CUSTOM PROCEDURE TRAY) ×2 IMPLANT
MANIFOLD NEPTUNE II (INSTRUMENTS) ×2 IMPLANT
NDL SAFETY ECLIPSE 18X1.5 (NEEDLE) ×2 IMPLANT
NEEDLE HYPO 18GX1.5 SHARP (NEEDLE) ×4
NS IRRIG 1000ML POUR BTL (IV SOLUTION) ×2 IMPLANT
PACK TOTAL JOINT (CUSTOM PROCEDURE TRAY) ×2 IMPLANT
PAD ABD 8X10 STRL (GAUZE/BANDAGES/DRESSINGS) ×2 IMPLANT
PADDING CAST COTTON 6X4 STRL (CAST SUPPLIES) ×6 IMPLANT
POSITIONER SURGICAL ARM (MISCELLANEOUS) ×2 IMPLANT
SET HNDPC FAN SPRY TIP SCT (DISPOSABLE) ×1 IMPLANT
SPONGE GAUZE 4X4 12PLY (GAUZE/BANDAGES/DRESSINGS) ×2 IMPLANT
STRIP CLOSURE SKIN 1/2X4 (GAUZE/BANDAGES/DRESSINGS) ×4 IMPLANT
SUCTION FRAZIER 12FR DISP (SUCTIONS) ×2 IMPLANT
SUT MNCRL AB 4-0 PS2 18 (SUTURE) ×2 IMPLANT
SUT VIC AB 2-0 CT1 27 (SUTURE) ×6
SUT VIC AB 2-0 CT1 TAPERPNT 27 (SUTURE) ×3 IMPLANT
SUT VLOC 180 0 24IN GS25 (SUTURE) ×2 IMPLANT
SYR 20CC LL (SYRINGE) ×2 IMPLANT
SYR 50ML LL SCALE MARK (SYRINGE) ×2 IMPLANT
TOWEL OR 17X26 10 PK STRL BLUE (TOWEL DISPOSABLE) ×3 IMPLANT
TRAY FOLEY CATH 14FRSI W/METER (CATHETERS) ×2 IMPLANT
WATER STERILE IRR 1500ML POUR (IV SOLUTION) ×3 IMPLANT
WRAP KNEE MAXI GEL POST OP (GAUZE/BANDAGES/DRESSINGS) ×2 IMPLANT

## 2013-05-08 NOTE — Anesthesia Postprocedure Evaluation (Signed)
  Anesthesia Post-op Note  Patient: [name]  Procedure(s) Performed: Procedure(s) (LRB): LEFT TOTAL KNEE ARTHROPLASTY (Left)  Patient Location: PACU  Anesthesia Type: Spinal  Level of Consciousness: awake and alert   Airway and Oxygen Therapy: Patient Spontanous Breathing  Post-op Pain: mild  Post-op Assessment: Post-op Vital signs reviewed, Patient's Cardiovascular Status Stable, Respiratory Function Stable, Patent Airway and No signs of Nausea or vomiting  Last Vitals:  Filed Vitals:   05/08/13 1500  BP: 176/78  Pulse: 55  Temp:   Resp: 16    Post-op Vital Signs: stable   Complications: No apparent anesthesia complications

## 2013-05-08 NOTE — H&P (View-Only) (Signed)
[name]. Chay  DOB: 29-Dec-1927 Married / Language: English / Race: White Male  Date of Admission:  05-08-2013  Chief Complaint:  Left Knee Pain  History of Present Illness The patient is a 79 year old male who comes in for a preoperative History and Physical. The patient is scheduled for a left total knee arthroplasty to be performed by Dr. Gus Rankin. Aluisio, MD at [facility] on 05/08/2013. The patient is a 79 year old male who presents today for follow up of their knee. The patient is being followed for their left knee pain and osteoarthritis. They are now 1 year(s) (9 months) out from the last visit. Symptoms reported today include: pain. The patient feels that they are doing poorly and report their pain level to be moderate. Current treatment includes: Glucosamine. The patient is now about 5 years out from right total knee arthroplasty. The patient states that he is doing well at this time. The pain is under excellent control at this time and describe their pain as mild. They are currently on no medication for their pain. The patient is currently doing home exercise program. The patient feels that they are progressing well at this time. The left knee is now the probelm and he is now ready to proceed with left total knee repalcement. They have been treated conservatively in the past for the above stated problem and despite conservative measures, they continue to have progressive pain and severe functional limitations and dysfunction. They have failed non-operative management including home exercise, medications. It is felt that they would benefit from undergoing total joint replacement. Risks and benefits of the procedure have been discussed with the patient and they elect to proceed with surgery. There are no active contraindications to surgery such as ongoing infection or rapidly progressive neurological disease.   Problem List/Past Medical Osteoarthritis, Hip  (715.35) S/P total knee replacement (V43.65) Primary osteoarthritis of one knee (715.16) Prostate Cancer Prostate Disease Seizure Disorder. last episode was about 35 years ago High blood pressure Osteoarthrosis NOS, lower leg (715.96). 03/20/2005 Measles Mumps Colonic Ulcerations   Allergies No Known Drug Allergies. 03/04/2011   Family History Cancer. First Degree Relatives. mother, father and brother   Social History Copy of Drug/Alcohol Rehab (Previously). no Children. 5 or more Alcohol use. current drinker; drinks hard liquor; 5-7 per week Marital status. married Current work status. retired Financial planner (Currently). no Illicit drug use. no Living situation. live with spouse Exercise. Exercises weekly; does other Tobacco use. former smoker; smoke(d) less than 1/2 pack(s) per day; uses less than half 1/2 can(s) smokeless per week Number of flights of stairs before winded. greater than 5 Pain Contract. no Tobacco / smoke exposure. no Post-Surgical Plans. Home Advance Directives. Living Will, Healthcare POA   Medication History PHENobarbital (30MG  Tablet, Oral) Active. Dilantin (100MG  Capsule, Oral) Active. eye caps Active. Metoprolol Tartrate (25MG  Tablet, Oral) Active. AmLODIPine Besylate (5MG  Tablet, Oral) Active. Naprosyn (250MG  Tablet, 1 Oral as needed) Active.    Past Surgical History Sinus Surgery Straighten Nasal Septum Cataract Surgery. bilateral Tonsillectomy  Review of Systems General:Not Present- Chills, Fever, Night Sweats, Fatigue, Weight Gain, Weight Loss and Memory Loss. Skin:Not Present- Hives, Itching, Rash, Eczema and Lesions. HEENT:Not Present- Tinnitus, Headache, Double Vision, Visual Loss, Hearing Loss and Dentures. Respiratory:Not Present- Shortness of breath with exertion, Shortness of breath at rest, Allergies, Coughing up blood and Chronic Cough. Cardiovascular:Not Present- Chest Pain,  Racing/skipping heartbeats, Difficulty Breathing Lying Down, Murmur, Swelling and Palpitations. Gastrointestinal:Not Present- Bloody  Stool, Heartburn, Abdominal Pain, Vomiting, Nausea, Constipation, Diarrhea, Difficulty Swallowing, Jaundice and Loss of appetitie. Male Genitourinary:Present- Urinating at Night. Not Present- Urinary frequency, Blood in Urine, Weak urinary stream, Discharge, Flank Pain, Incontinence, Painful Urination, Urgency and Urinary Retention. Musculoskeletal:Present- Joint Pain. Not Present- Muscle Weakness, Muscle Pain, Joint Swelling, Back Pain, Morning Stiffness and Spasms. Neurological:Not Present- Tremor, Dizziness, Blackout spells, Paralysis, Difficulty with balance and Weakness. Psychiatric:Not Present- Insomnia.    Vitals Weight: 185 lb Height: 71 in Body Surface Area: 2.05 m Body Mass Index: 25.8 kg/m Pulse: 76 (Regular) Resp.: 16 (Unlabored) BP: 138/70 (Sitting, Left Arm, Standard)     Physical Exam The physical exam findings are as follows:   General Mental Status - Alert, cooperative and good historian. General Appearance- pleasant. Not in acute distress. Orientation- Oriented X3. Build & Nutrition- Well nourished and Well developed.   Head and Neck Head- normocephalic, atraumatic . Neck Global Assessment- supple. no bruit auscultated on the right and no bruit auscultated on the left.   Eye Pupil- Bilateral- Regular and Round. Motion- Bilateral- EOMI.   Chest and Lung Exam Auscultation: Breath sounds:- clear at anterior chest wall and - clear at posterior chest wall. Adventitious sounds:- No Adventitious sounds.   Cardiovascular Auscultation:Rhythm- Regular rate and rhythm. Heart Sounds- S1 WNL and S2 WNL. Murmurs & Other Heart Sounds:Auscultation of the heart reveals - No Murmurs.   Abdomen Inspection:Umbilicus- Note: colostomy bag just left of the umbilicus, left lower quad of  abdomen Palpation/Percussion:Tenderness- Abdomen is non-tender to palpation. Rigidity (guarding)- Abdomen is soft. Auscultation:Auscultation of the abdomen reveals - Bowel sounds normal.   Male Genitourinary Not done, not pertinent to present illness  Musculoskeletal On exam well developed male, alert and oriented in no apparent distress. His right knee looks great. There is no swelling. Range 0 to 130 with no tenderness or instability. The left knee varus deformity, marked crepitus on range of motion. Tender medial greater than lateral with no instability noted.  RADIOGRAPHS: AP both knees and lateral show the prosthesis on the right in excellent position. No periprosthetic abnormalities. On the left he is bone on bone medial and patellofemoral.   Assessment & Plan Primary osteoarthritis of one knee (715.16) Impression: Left Knee  Note: Plan is for a Left Total Knee Replacement by Dr. Lequita Halt.  Plan is to go home  PCP - Dr. Drucie Opitz - Patient has been seen preoperatively and felt to be stable for surgery.  The patient will not receive TXA (tranexamic acid) due to: Prostate Cancer  Signed electronically by Lauraine Rinne, III PA-C

## 2013-05-08 NOTE — Progress Notes (Signed)
Utilization review completed.  

## 2013-05-08 NOTE — Anesthesia Preprocedure Evaluation (Addendum)
Anesthesia Evaluation  Patient identified by MRN, date of birth, ID band Patient awake    Reviewed: Allergy & Precautions, H&P , NPO status , Patient's Chart, lab work & pertinent test results  Airway Mallampati: II TM Distance: >3 FB Neck ROM: Full    Dental no notable dental hx.    Pulmonary former smoker,  breath sounds clear to auscultation  Pulmonary exam normal       Cardiovascular hypertension, Pt. on medications Rhythm:Regular Rate:Normal     Neuro/Psych negative neurological ROS  negative psych ROS   GI/Hepatic negative GI ROS, Neg liver ROS,   Endo/Other  negative endocrine ROS  Renal/GU negative Renal ROS  negative genitourinary   Musculoskeletal negative musculoskeletal ROS (+)   Abdominal   Peds negative pediatric ROS (+)  Hematology  (+) anemia ,   Anesthesia Other Findings   Reproductive/Obstetrics negative OB ROS                          Anesthesia Physical Anesthesia Plan  ASA: II  Anesthesia Plan: Spinal   Post-op Pain Management:    Induction: Intravenous  Airway Management Planned: Simple Face Mask  Additional Equipment:   Intra-op Plan:   Post-operative Plan:   Informed Consent: I have reviewed the patients History and Physical, chart, labs and discussed the procedure including the risks, benefits and alternatives for the proposed anesthesia with the patient or authorized representative who has indicated his/her understanding and acceptance.     Plan Discussed with: CRNA and Surgeon  Anesthesia Plan Comments:         Anesthesia Quick Evaluation

## 2013-05-08 NOTE — Preoperative (Signed)
Beta Blockers   Reason not to administer Beta Blockers:Not Applicable 

## 2013-05-08 NOTE — Op Note (Signed)
Pre-operative diagnosis- Osteoarthritis  Left knee(s)  Post-operative diagnosis- Osteoarthritis Left knee(s)  Procedure-  Left  Total Knee Arthroplasty  Surgeon- Jordan Rankin. Khalid Lacko, MD  Assistant- Avel Peace, PA-C   Anesthesia-  Spinal EBL-* No blood loss amount entered *  Drains Hemovac  Tourniquet time- 32 minutes @ 300 mm Hg  Complications- None  Condition-PACU - hemodynamically stable.   Brief Clinical Note   [name] is a 79 y.o. year old male with end stage OA of his left knee with progressively worsening pain and dysfunction. He has constant pain, with activity and at rest and significant functional deficits with difficulties even with ADLs. He has had extensive non-op management including analgesics, injections of cortisone and viscosupplements, and home exercise program, but remains in significant pain with significant dysfunction. Radiographs show bone on bone arthritis medial with varus deformity. He presents now for left Total Knee Arthroplasty.     Procedure in detail---   The patient is brought into the operating room and positioned supine on the operating table. After successful administration of  Spinal,   a tourniquet is placed high on the  Left thigh(s) and the lower extremity is prepped and draped in the usual sterile fashion. Time out is performed by the operating team and then the  Left lower extremity is wrapped in Esmarch, knee flexed and the tourniquet inflated to 300 mmHg.       A midline incision is made with a ten blade through the subcutaneous tissue to the level of the extensor mechanism. A fresh blade is used to make a medial parapatellar arthrotomy. Soft tissue over the proximal medial tibia is subperiosteally elevated to the joint line with a knife and into the semimembranosus bursa with a Cobb elevator. Soft tissue over the proximal lateral tibia is elevated with attention being paid to avoiding the patellar tendon on the tibial tubercle. The patella  is everted, knee flexed 90 degrees and the ACL and PCL are removed. Findings are bone on bone medial compartment with large medial osteophytes.        The drill is used to create a starting hole in the distal femur and the canal is thoroughly irrigated with sterile saline to remove the fatty contents. The 5 degree Left  valgus alignment guide is placed into the femoral canal and the distal femoral cutting block is pinned to remove 10 mm off the distal femur. Resection is made with an oscillating saw.      The tibia is subluxed forward and the menisci are removed. The extramedullary alignment guide is placed referencing proximally at the medial aspect of the tibial tubercle and distally along the second metatarsal axis and tibial crest. The block is pinned to remove 2mm off the more deficient medial  side. Resection is made with an oscillating saw. Size 5is the most appropriate size for the tibia and the proximal tibia is prepared with the modular drill and keel punch for that size.      The femoral sizing guide is placed and size 5 is most appropriate. Rotation is marked off the epicondylar axis and confirmed by creating a rectangular flexion gap at 90 degrees. The size 5 cutting block is pinned in this rotation and the anterior, posterior and chamfer cuts are made with the oscillating saw. The intercondylar block is then placed and that cut is made.      Trial size 5 tibial component, trial size 5 posterior stabilized femur and a 15  mm posterior stabilized  rotating platform insert trial is placed. Full extension is achieved with excellent varus/valgus and anterior/posterior balance throughout full range of motion. The patella is everted and thickness measured to be 27  mm. Free hand resection is taken to 15 mm, a 38 template is placed, lug holes are drilled, trial patella is placed, and it tracks normally. Osteophytes are removed off the posterior femur with the trial in place. All trials are removed and the  cut bone surfaces prepared with pulsatile lavage. Cement is mixed and once ready for implantation, the size 5 tibial implant, size  5 posterior stabilized femoral component, and the size 38 patella are cemented in place and the patella is held with the clamp. The trial insert is placed and the knee held in full extension. The Exparel (20 ml mixed with 30 ml saline) and .25% Bupivicaine, are injected into the extensor mechanism, posterior capsule, medial and lateral gutters and subcutaneous tissues.  All extruded cement is removed and once the cement is hard the permanent 15 mm posterior stabilized rotating platform insert is placed into the tibial tray.      The wound is copiously irrigated with saline solution and the extensor mechanism closed over a hemovac drain with #1 PDS suture. The tourniquet is released for a total tourniquet time of 32  minutes. Flexion against gravity is 130 degrees and the patella tracks normally. Subcutaneous tissue is closed with 2.0 vicryl and subcuticular with running 4.0 Monocryl. The incision is cleaned and dried and steri-strips and a bulky sterile dressing are applied. The limb is placed into a knee immobilizer and the patient is awakened and transported to recovery in stable condition.      Please note that a surgical assistant was a medical necessity for this procedure in order to perform it in a safe and expeditious manner. Surgical assistant was necessary to retract the ligaments and vital neurovascular structures to prevent injury to them and also necessary for proper positioning of the limb to allow for anatomic placement of the prosthesis.   Jordan Rankin Brystol Wasilewski, MD    05/08/2013, 1:57 PM

## 2013-05-08 NOTE — Transfer of Care (Signed)
Immediate Anesthesia Transfer of Care Note  Patient: [name]  Procedure(s) Performed: Procedure(s): LEFT TOTAL KNEE ARTHROPLASTY (Left)  Patient Location: PACU  Anesthesia Type:Spinal  Level of Consciousness: awake, alert  and oriented  Airway & Oxygen Therapy: Patient Spontanous Breathing and Patient connected to face mask oxygen  Post-op Assessment: Report given to PACU RN and Post -op Vital signs reviewed and stable  Post vital signs: Reviewed and stable  Complications: No apparent anesthesia complications

## 2013-05-08 NOTE — Interval H&P Note (Signed)
History and Physical Interval Note:  05/08/2013 10:41 AM  [name]  has presented today for surgery, with the diagnosis of Osteoarthritis of the Left Knee  The various methods of treatment have been discussed with the patient and family. After consideration of risks, benefits and other options for treatment, the patient has consented to  Procedure(s): LEFT TOTAL KNEE ARTHROPLASTY (Left) as a surgical intervention .  The patient's history has been reviewed, patient examined, no change in status, stable for surgery.  I have reviewed the patient's chart and labs.  Questions were answered to the patient's satisfaction.     Loanne Drilling

## 2013-05-08 NOTE — Anesthesia Procedure Notes (Signed)
Spinal  Patient location during procedure: OR Staffing Anesthesiologist: Elowyn Raupp Performed by: anesthesiologist  Preanesthetic Checklist Completed: patient identified, site marked, surgical consent, pre-op evaluation, timeout performed, IV checked, risks and benefits discussed and monitors and equipment checked Spinal Block Patient position: sitting Prep: Betadine Patient monitoring: heart rate, continuous pulse ox and blood pressure Approach: right paramedian Location: L3-4 Injection technique: single-shot Needle Needle type: Spinocan  Needle gauge: 22 G Needle length: 9 cm Additional Notes Expiration date of kit checked and confirmed. Patient tolerated procedure well, without complications.     

## 2013-05-09 ENCOUNTER — Encounter (HOSPITAL_COMMUNITY): Payer: Self-pay | Admitting: Orthopedic Surgery

## 2013-05-09 DIAGNOSIS — D62 Acute posthemorrhagic anemia: Secondary | ICD-10-CM | POA: Diagnosis not present

## 2013-05-09 DIAGNOSIS — E871 Hypo-osmolality and hyponatremia: Secondary | ICD-10-CM | POA: Diagnosis not present

## 2013-05-09 LAB — CBC
HCT: 23.1 % — ABNORMAL LOW (ref 39.0–52.0)
MCH: 34.6 pg — ABNORMAL HIGH (ref 26.0–34.0)
Platelets: 128 10*3/uL — ABNORMAL LOW (ref 150–400)
RDW: 14.5 % (ref 11.5–15.5)
WBC: 4.6 10*3/uL (ref 4.0–10.5)

## 2013-05-09 LAB — BASIC METABOLIC PANEL
Chloride: 102 mEq/L (ref 96–112)
GFR calc Af Amer: 52 mL/min — ABNORMAL LOW (ref >90)
Potassium: 3.9 mEq/L (ref 3.5–5.1)
Sodium: 133 mEq/L — ABNORMAL LOW (ref 135–145)

## 2013-05-09 MED ORDER — PHENOBARBITAL 32.4 MG PO TABS
32.4000 mg | ORAL_TABLET | Freq: Every day | ORAL | Status: DC
  Administered 2013-05-09 – 2013-05-10 (×2): 32.4 mg via ORAL
  Filled 2013-05-09 (×2): qty 1

## 2013-05-09 MED ORDER — PHENOBARBITAL 32.4 MG PO TABS
64.8000 mg | ORAL_TABLET | Freq: Every day | ORAL | Status: DC
  Administered 2013-05-09 – 2013-05-11 (×3): 64.8 mg via ORAL
  Filled 2013-05-09 (×3): qty 2

## 2013-05-09 MED ORDER — SODIUM CHLORIDE 0.9 % IV BOLUS (SEPSIS)
250.0000 mL | Freq: Once | INTRAVENOUS | Status: AC
  Administered 2013-05-09: 250 mL via INTRAVENOUS

## 2013-05-09 NOTE — Progress Notes (Signed)
Physical Therapy Treatment Patient Details Name: [name] MRN: [id number] DOB: 1928/03/28 Today's Date: 05/09/2013 Time: 1355-1410 PT Time Calculation (min): 15 min  PT Assessment / Plan / Recommendation  History of Present Illness LTKA 05/08/13   PT Comments   Pt having more pain this PM. Slowly progressing.  Follow Up Recommendations  SNF     Does the patient have the potential to tolerate intense rehabilitation     Barriers to Discharge Decreased caregiver support      Equipment Recommendations  None recommended by PT    Recommendations for Other Services    Frequency 7X/week   Progress towards PT Goals Progress towards PT goals: Progressing toward goals  Plan Current plan remains appropriate    Precautions / Restrictions Precautions Precautions: Knee;Fall Precaution Comments: monitor BP due to ABLA Required Braces or Orthoses: Knee Immobilizer - Left Restrictions Weight Bearing Restrictions: No   Pertinent Vitals/Pain 8 R L knee, RN to give meds,    Mobility  Bed Mobility Bed Mobility: Sit to Supine Supine to Sit: 4: Min assist Sit to Supine: 4: Min assist Details for Bed Mobility Assistance: assisted LLE onto bed. Transfers Transfers: Sit to Stand;Stand to Sit Sit to Stand: 4: Min assist;With upper extremity assist;With armrests;From chair/3-in-1 Stand to Sit: 4: Min assist;With armrests;With upper extremity assist;To bed Details for Transfer Assistance: VC's for safety, sequencing w/ RW and hand placement. Ambulation/Gait Ambulation/Gait Assistance: 4: Min assist Ambulation/Gait: Patient Percentage: 70% Ambulation Distance (Feet): 20 Feet Assistive device: Rolling walker Ambulation/Gait Assistance Details: cues for posture and sequence. Gait Pattern: Step-to pattern;Antalgic    Exercises Total Joint Exercises Quad Sets: AROM;Left;10 reps;Supine Heel Slides: AAROM;Left;10 reps;Supine Hip ABduction/ADduction: AAROM;Left;10 reps;Supine Straight Leg  Raises: AAROM;Left;10 reps;Supine Goniometric ROM: 10-45 L knee   PT Diagnosis: Difficulty walking;Acute pain  PT Problem List: Decreased strength;Decreased range of motion;Decreased activity tolerance;Decreased mobility;Decreased knowledge of precautions;Decreased safety awareness;Decreased knowledge of use of DME PT Treatment Interventions: DME instruction;Gait training;Functional mobility training;Therapeutic activities;Therapeutic exercise;Patient/family education   PT Goals (current goals can now be found in the care plan section) Acute Rehab PT Goals Patient Stated Goal: To go to rehab befroe home (SNF) PT Goal Formulation: With patient Time For Goal Achievement: 05/16/13 Potential to Achieve Goals: Good  Visit Information  Last PT Received On: 05/09/13 Assistance Needed: +1 History of Present Illness: LTKA 05/08/13    Subjective Data  Patient Stated Goal: To go to rehab befroe home (SNF)   Cognition  Cognition Arousal/Alertness: Awake/alert Behavior During Therapy: WFL for tasks assessed/performed Overall Cognitive Status: Within Functional Limits for tasks assessed    Balance  Balance Balance Assessed: Yes Static Sitting Balance Static Sitting - Balance Support: Feet supported;Right upper extremity supported Static Standing Balance Static Standing - Balance Support: Bilateral upper extremity supported  End of Session PT - End of Session Equipment Utilized During Treatment: Left knee immobilizer Activity Tolerance: Patient limited by fatigue;Patient limited by pain Patient left: in bed;with call bell/phone within reach Nurse Communication: Patient requests pain meds;Mobility status CPM Left Knee CPM Left Knee: On   GP     Rada Hay 05/09/2013, 3:15 PM

## 2013-05-09 NOTE — Progress Notes (Signed)
Clinical Social Work Department BRIEF PSYCHOSOCIAL ASSESSMENT 05/09/2013  Patient:  [name], [name]     Account Number:  [id number]     Admit date:  05/08/2013  Clinical Social Worker:  Candie Chroman  Date/Time:  05/09/2013 01:06 PM  Referred by:  Physician  Date Referred:  05/09/2013 Referred for  SNF Placement   Other Referral:   Interview type:  Patient Other interview type:    PSYCHOSOCIAL DATA Living Status:  WIFE Admitted from facility:   Level of care:   Primary support name:  Grover Robinson Primary support relationship to patient:  SPOUSE Degree of support available:   unclear    CURRENT CONCERNS Current Concerns  Post-Acute Placement   Other Concerns:    SOCIAL WORK ASSESSMENT / PLAN Pt is an 79 yr old gentleman living at home prior to hospitalization. CSW met with pt to assist with d/c planning. Pt has made prior arrangements to have ST Rehab at [facility] following hospital d/c. CSW has contacted SNF and d/c plans have been confirmed. CSW will continue to follow pt to assist with d/c planning to SNF.   Assessment/plan status:  Psychosocial Support/Ongoing Assessment of Needs Other assessment/ plan:   Information/referral to community resources:   None needed at this time.    PATIENT'S/FAMILY'S RESPONSE TO PLAN OF CARE: Pt is looking forward to having rehab at [facility]

## 2013-05-09 NOTE — Progress Notes (Signed)
Clinical Social Work Department CLINICAL SOCIAL WORK PLACEMENT NOTE 05/09/2013  Patient:  [name], [name]  Account Number:  [id number] Admit date:  05/08/2013  Clinical Social Worker:  Cori Razor, LCSW  Date/time:  05/09/2013 01:37 PM  Clinical Social Work is seeking post-discharge placement for this patient at the following level of care:   SKILLED NURSING   (*CSW will update this form in Epic as items are completed)     Patient/family provided with [facility] Department of Clinical Social Work's list of facilities offering this level of care within the geographic area requested by the patient (or if unable, by the patient's family).  05/09/2013  Patient/family informed of their freedom to choose among providers that offer the needed level of care, that participate in Medicare, Medicaid or managed care program needed by the patient, have an available bed and are willing to accept the patient.    Patient/family informed of [facility]' ownership interest in [facility], as well as of the fact that they are under no obligation to receive care at this facility.  PASARR submitted to EDS on 05/09/2013 PASARR number received from EDS on 05/09/2013  FL2 transmitted to all facilities in geographic area requested by pt/family on  05/09/2013 FL2 transmitted to all facilities within larger geographic area on   Patient informed that his/her managed care company has contracts with or will negotiate with  certain facilities, including the following:     Patient/family informed of bed offers received:  05/09/2013 Patient chooses bed at [facility] Physician recommends and patient chooses bed at    Patient to be transferred to [facility] on   Patient to be transferred to facility by   The following physician request were entered in Epic:   Additional Comments:  Cori Razor LCSW [phone]

## 2013-05-09 NOTE — Progress Notes (Signed)
   Subjective: 1 Day Post-Op Procedure(s) (LRB): LEFT TOTAL KNEE ARTHROPLASTY (Left) Patient reports pain as mild and moderate.   Patient seen in rounds with Dr. Lequita Halt. Patient is well, and has had no acute complaints or problems We will start therapy today.  Plan is to go Skilled nursing facility after hospital stay.  Objective: Vital signs in last 24 hours: Temp:  [97.4 F (36.3 C)-98.7 F (37.1 C)] 97.9 F (36.6 C) (12/09 0645) Pulse Rate:  [51-77] 68 (12/09 0645) Resp:  [12-23] 16 (12/09 0645) BP: (101-184)/(60-87) 116/65 mmHg (12/09 0736) SpO2:  [98 %-100 %] 99 % (12/09 0645) Weight:  [84.823 kg (187 lb)] 84.823 kg (187 lb) (12/08 1530)  Intake/Output from previous day:  Intake/Output Summary (Last 24 hours) at 05/09/13 0851 Last data filed at 05/09/13 0726  Gross per 24 hour  Intake   3250 ml  Output   2500 ml  Net    750 ml    Intake/Output this shift: UOP 200 cc since MN - continue to monitor today Total I/O In: 240 [P.O.:240] Out: -   Labs:  Recent Labs  05/09/13 0511  HGB 8.0*    Recent Labs  05/09/13 0511  WBC 4.6  RBC 2.31*  HCT 23.1*  PLT 128*    Recent Labs  05/09/13 0511  NA 133*  K 3.9  CL 102  CO2 21  BUN 28*  CREATININE 1.40*  GLUCOSE 124*  CALCIUM 7.7*   No results found for this basename: LABPT, INR,  in the last 72 hours  EXAM General - Patient is Alert, Appropriate and Oriented Extremity - Neurovascular intact Sensation intact distally Dorsiflexion/Plantar flexion intact Dressing - dressing C/D/I Motor Function - intact, moving foot and toes well on exam.  Hemovac pulled without difficulty.  Past Medical History  Diagnosis Date  . Hypertension   . DDD (degenerative disc disease), lumbar   . 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 Seizure     none  x 35 yrs  . Macular degeneration     left/ dry    Assessment/Plan: 1 Day Post-Op Procedure(s) (LRB): LEFT TOTAL KNEE  ARTHROPLASTY (Left) Principal Problem:   OA (osteoarthritis) of knee Active Problems:   Hyponatremia   Acute blood loss anemia  Estimated body mass index is 26.83 kg/(m^2) as calculated from the following:   Height as of this encounter: 5\' 10"  (1.778 m).   Weight as of this encounter: 84.823 kg (187 lb). Up with therapy Continue foley due to strict I&O and urinary output monitoring Discharge to SNF  DVT Prophylaxis - Lovenox Weight-Bearing as tolerated to left leg D/C O2 and Pulse OX and try on Room Air Fluids this morning and monitor output.  [name] 05/09/2013, 8:51 AM

## 2013-05-09 NOTE — Evaluation (Signed)
Occupational Therapy Evaluation Patient Details Name: [name] MRN: [id number] DOB: [birth year] Today's Date: 05/09/2013 Time: 7829-5621 OT Time Calculation (min): 21 min  OT Assessment / Plan / Recommendation History of present illness LTKA 05/08/13   Clinical Impression   Pt is an 79 y/o male s/p L TKA on 05/08/13 whom presents w/ deficits in his ability to perform LB ADL's and functional transfers. He will benefit from SNF rehab prior to d/c home secondary to decreased caregiver support (wife recently dx w/ PNA & unable to assist PRN). Will follow acutely for OT.    OT Assessment  Patient needs continued OT Services    Follow Up Recommendations  SNF    Barriers to Discharge Decreased caregiver support    Equipment Recommendations  None recommended by OT;Other (comment) (Defer to next venue)    Recommendations for Other Services    Frequency  Min 2X/week    Precautions / Restrictions Precautions Precautions: Knee;Fall Precaution Comments: monitor BP due to ABLA Required Braces or Orthoses: Knee Immobilizer - Left Restrictions Weight Bearing Restrictions: No   Pertinent Vitals/Pain 5/10 L knee, pt reports taking pain medication via IV approximately 30 min prior to treatment session. "It was bad before that, not too bad now" Repositioned, ice applied.    ADL  Eating/Feeding: Performed;Independent Where Assessed - Eating/Feeding: Chair Grooming: Simulated;Modified independent Where Assessed - Grooming: Supported sitting Upper Body Bathing: Simulated;Set up Where Assessed - Upper Body Bathing: Supported sitting Lower Body Bathing: Simulated;Moderate assistance Where Assessed - Lower Body Bathing: Supported sit to stand Upper Body Dressing: Simulated;Set up Where Assessed - Upper Body Dressing: Supported sitting;Unsupported sitting Lower Body Dressing: Simulated;Maximal assistance Where Assessed - Lower Body Dressing: Supported sit to stand Toilet Transfer:  Simulated;Minimal assistance (Transferred chair to door in room and back to chair) Toilet Transfer Method: Sit to stand Toilet Transfer Equipment: Bedside commode Toileting - Clothing Manipulation and Hygiene: Simulated;Minimal assistance Where Assessed - Engineer, mining and Hygiene: Sit to stand from 3-in-1 or toilet Tub/Shower Transfer Method: Not assessed Equipment Used: Gait belt;Knee Immobilizer;Rolling walker Transfers/Ambulation Related to ADLs: Pt was Min assist for sit to stand and simulated toilet transfer today using RW & L KI, from chair level. Pt requires VC's for safety and sequencing w/ RW as well as hand placement. ADL Comments: Pt was educated in role of OT. Discussed toileting transfers and pt performed functional mobility in room from chair to door and back to chair. He requires Min assist & vc's for safety, sequencing and hand placement. Recommend SNF Rehab secondary to decreased caregiver support at home (pt reports that his wife was recently d/c from hospital w/ dx PNA & unable to fully assist PRN at this time). Discussed ADL's and goals/recommendations w/ pt whom verbalized aggreement.    OT Diagnosis: Generalized weakness;Acute pain  OT Problem List: Decreased activity tolerance;Decreased knowledge of precautions;Decreased knowledge of use of DME or AE;Pain OT Treatment Interventions: Self-care/ADL training;Therapeutic exercise;DME and/or AE instruction;Patient/family education;Therapeutic activities;Balance training   OT Goals(Current goals can be found in the care plan section) Acute Rehab OT Goals Patient Stated Goal: To go to rehab befroe home (SNF) OT Goal Formulation: With patient Time For Goal Achievement: 05/23/13 Potential to Achieve Goals: Good  Visit Information  Last OT Received On: 05/09/13 Assistance Needed: +2 History of Present Illness: LTKA 05/08/13       Prior Functioning     Home Living Family/patient expects to be discharged  to:: Skilled nursing facility Living Arrangements: Spouse/significant other Home  Equipment: Dan Humphreys - 2 wheels;Bedside commode;Shower seat;Grab bars - tub/shower Prior Function Level of Independence: Independent Communication Communication: No difficulties Dominant Hand: Right   Vision/Perception Vision - History Baseline Vision: Wears glasses all the time Visual History: Macular degeneration (Left eye) Patient Visual Report: No change from baseline   Cognition  Cognition Arousal/Alertness: Awake/alert Behavior During Therapy: WFL for tasks assessed/performed Overall Cognitive Status: Within Functional Limits for tasks assessed    Extremity/Trunk Assessment Upper Extremity Assessment Upper Extremity Assessment: Overall WFL for tasks assessed Lower Extremity Assessment Lower Extremity Assessment: Defer to PT evaluation LLE Deficits / Details: pt able to perform SLR    Mobility Bed Mobility Bed Mobility: Not assessed Supine to Sit: 4: Min assist Details for Bed Mobility Assistance: Pt up in chair Transfers Transfers: Sit to Stand;Stand to Sit Sit to Stand: 4: Min assist;With upper extremity assist;With armrests;From chair/3-in-1 Stand to Sit: 4: Min assist;With armrests;With upper extremity assist;To chair/3-in-1 Details for Transfer Assistance: VC's for safety, sequencing w/ RW and hand placement.        Balance Balance Balance Assessed: Yes Static Sitting Balance Static Sitting - Balance Support: Feet supported;Right upper extremity supported Static Standing Balance Static Standing - Balance Support: Bilateral upper extremity supported   End of Session OT - End of Session Equipment Utilized During Treatment: Gait belt;Rolling walker;Left knee immobilizer Activity Tolerance: Patient tolerated treatment well Patient left: in chair;with call bell/phone within reach  GO     Alm Bustard 05/09/2013, 12:11 PM

## 2013-05-09 NOTE — Evaluation (Signed)
Physical Therapy Evaluation Patient Details Name: [name] MRN: [id number] DOB: [birth year] Today's Date: 05/09/2013 Time: 9147-8295 PT Time Calculation (min): 18 min  PT Assessment / Plan / Recommendation History of Present Illness  LTKA 05/08/13  Clinical Impression  Pt c/o feeling dizzy while ambulating. BP 159/79 Hr 90 sats 100. Pt plans SNF . Pt will benefit from PT to address problems.    PT Assessment  Patient needs continued PT services    Follow Up Recommendations  SNF    Does the patient have the potential to tolerate intense rehabilitation      Barriers to Discharge Decreased caregiver support      Equipment Recommendations  None recommended by PT    Recommendations for Other Services     Frequency 7X/week    Precautions / Restrictions Precautions Precautions: Knee;Fall Precaution Comments: monitor BP due to ABLA Restrictions Weight Bearing Restrictions: No   Pertinent Vitals/Pain See clinical impression for VS      Mobility  Bed Mobility Bed Mobility: Supine to Sit Supine to Sit: 4: Min assist Details for Bed Mobility Assistance: pt able to slide LLE to edge of bed,  Transfers Transfers: Sit to Stand;Stand to Sit Sit to Stand: 4: Min assist Stand to Sit: 4: Min assist Details for Transfer Assistance: cues for hand placement and LLE position Ambulation/Gait Ambulation/Gait Assistance: 1: +2 Total assist Ambulation/Gait: Patient Percentage: 70% Ambulation Distance (Feet): 50 Feet Assistive device: Rolling walker Ambulation/Gait Assistance Details: cues for safe use of RW, sequence and posture.    Exercises     PT Diagnosis: Difficulty walking;Acute pain  PT Problem List: Decreased strength;Decreased range of motion;Decreased activity tolerance;Decreased mobility;Decreased knowledge of precautions;Decreased safety awareness;Decreased knowledge of use of DME PT Treatment Interventions: DME instruction;Gait training;Functional mobility  training;Therapeutic activities;Therapeutic exercise;Patient/family education     PT Goals(Current goals can be found in the care plan section) Acute Rehab PT Goals Patient Stated Goal: to go to rehab. PT Goal Formulation: With patient Time For Goal Achievement: 05/16/13 Potential to Achieve Goals: Good  Visit Information  Last PT Received On: 05/09/13 Assistance Needed: +2 History of Present Illness: LTKA 05/08/13       Prior Functioning  Home Living Family/patient expects to be discharged to:: Skilled nursing facility Living Arrangements: Spouse/significant other Home Equipment: Dan Humphreys - 2 wheels;Bedside commode;Shower seat;Grab bars - tub/shower Prior Function Level of Independence: Independent Communication Communication: No difficulties Dominant Hand: Right    Cognition  Cognition Arousal/Alertness: Awake/alert Behavior During Therapy: WFL for tasks assessed/performed Overall Cognitive Status: Within Functional Limits for tasks assessed    Extremity/Trunk Assessment Upper Extremity Assessment Upper Extremity Assessment: Defer to OT evaluation Lower Extremity Assessment Lower Extremity Assessment: LLE deficits/detail LLE Deficits / Details: pt able to perform SLR   Balance    End of Session PT - End of Session Equipment Utilized During Treatment: Left knee immobilizer Activity Tolerance: Patient limited by fatigue Patient left: in chair;with call bell/phone within reach Nurse Communication: Mobility status CPM Left Knee CPM Left Knee: Off  GP     Rada Hay 05/09/2013, 11:27 AM Blanchard Kelch PT [phone]

## 2013-05-10 DIAGNOSIS — Z9289 Personal history of other medical treatment: Secondary | ICD-10-CM

## 2013-05-10 LAB — CBC
HCT: 21.5 % — ABNORMAL LOW (ref 39.0–52.0)
Hemoglobin: 7.5 g/dL — ABNORMAL LOW (ref 13.0–17.0)
RBC: 2.17 MIL/uL — ABNORMAL LOW (ref 4.22–5.81)
WBC: 4.8 10*3/uL (ref 4.0–10.5)

## 2013-05-10 LAB — BASIC METABOLIC PANEL
BUN: 18 mg/dL (ref 6–23)
CO2: 21 mEq/L (ref 19–32)
Chloride: 100 mEq/L (ref 96–112)
Creatinine, Ser: 1.1 mg/dL (ref 0.50–1.35)
GFR calc Af Amer: 70 mL/min — ABNORMAL LOW (ref >90)
Glucose, Bld: 105 mg/dL — ABNORMAL HIGH (ref 70–99)
Potassium: 3.8 mEq/L (ref 3.5–5.1)

## 2013-05-10 MED ORDER — ACETAMINOPHEN 325 MG PO TABS
650.0000 mg | ORAL_TABLET | Freq: Once | ORAL | Status: AC
  Administered 2013-05-10: 09:00:00 650 mg via ORAL
  Filled 2013-05-10: qty 2

## 2013-05-10 MED ORDER — POLYSACCHARIDE IRON COMPLEX 150 MG PO CAPS
150.0000 mg | ORAL_CAPSULE | Freq: Two times a day (BID) | ORAL | Status: DC
  Administered 2013-05-10 – 2013-05-11 (×3): 150 mg via ORAL
  Filled 2013-05-10 (×4): qty 1

## 2013-05-10 NOTE — Care Management Note (Signed)
    Page 1 of 1   05/10/2013     5:00:33 PM   CARE MANAGEMENT NOTE 05/10/2013  Patient:  [name], [name]   Account Number:  [id number]  Date Initiated:  05/10/2013  Documentation initiated by:  Colleen Can  Subjective/Objective Assessment:   dx left knee replacement     Action/Plan:   SNF   Anticipated DC Date:  05/10/2013   Anticipated DC Plan:  SKILLED NURSING FACILITY  In-house referral  Clinical Social Worker      DC Planning Services  CM consult      Choice offered to / List presented to:             Status of service:  Completed, signed off Medicare Important Message given?  NA - LOS <3 / Initial given by admissions (If response is "NO", the following Medicare IM given date fields will be blank) Date Medicare IM given:   Date Additional Medicare IM given:    Discharge Disposition:    Per UR Regulation:    If discussed at Long Length of Stay Meetings, dates discussed:    Comments:

## 2013-05-10 NOTE — Progress Notes (Signed)
Physical Therapy Treatment Patient Details Name: [name] MRN: [id number] DOB: 1928-05-14 Today's Date: 05/10/2013 Time: 0347-4259 PT Time Calculation (min): 16 min  PT Assessment / Plan / Recommendation  History of Present Illness LTKA 05/08/13   PT Comments   Pt improving in mobility. Cues for safe sequence w/ RW.  Follow Up Recommendations  SNF     Does the patient have the potential to tolerate intense rehabilitation     Barriers to Discharge        Equipment Recommendations  None recommended by PT    Recommendations for Other Services    Frequency 7X/week   Progress towards PT Goals Progress towards PT goals: Progressing toward goals  Plan Current plan remains appropriate    Precautions / Restrictions Precautions Precautions: Knee;Fall Required Braces or Orthoses: Knee Immobilizer - Left   Pertinent Vitals/Pain 3-6 with weight on L leg    Mobility  Bed Mobility Supine to Sit: 4: Min assist Details for Bed Mobility Assistance: assisted LLE to floor. Transfers Sit to Stand: 4: Min assist;With upper extremity assist;From chair/3-in-1;From bed Stand to Sit: 4: Min assist;With armrests;With upper extremity assist;To chair/3-in-1 Details for Transfer Assistance: VC's for safety, sequencing w/ RW and hand placement. Ambulation/Gait Ambulation/Gait Assistance: 4: Min assist Ambulation Distance (Feet): 80 Feet Assistive device: Rolling walker Ambulation/Gait Assistance Details: getting blood. Gait Pattern: Step-to pattern;Antalgic    Exercises Total Joint Exercises Quad Sets: AROM;Left;10 reps;Supine Short Arc Quad: AAROM;Left;10 reps;Supine Heel Slides: AAROM;Left;10 reps;Supine Hip ABduction/ADduction: AAROM;Left;10 reps;Supine Straight Leg Raises: AAROM;Left;10 reps;Supine Goniometric ROM: 10-45    PT Diagnosis:    PT Problem List:   PT Treatment Interventions:     PT Goals (current goals can now be found in the care plan section)    Visit  Information  Last PT Received On: 05/10/13 Assistance Needed: +1 History of Present Illness: LTKA 05/08/13    Subjective Data      Cognition  Cognition Arousal/Alertness: Awake/alert    Balance     End of Session PT - End of Session Equipment Utilized During Treatment: Gait belt Activity Tolerance: Patient tolerated treatment well Patient left: in chair;with call bell/phone within reach Nurse Communication: Mobility status   GP     Rada Hay 05/10/2013, 4:09 PM

## 2013-05-10 NOTE — Progress Notes (Signed)
Physical Therapy Treatment Patient Details Name: [name] MRN: [id number] DOB: 11/30/1927 Today's Date: 05/10/2013 Time: 1002-1027 PT Time Calculation (min): 25 min  PT Assessment / Plan / Recommendation  History of Present Illness LTKA 05/08/13   PT Comments   Pt tolerated exercises. Will get up after blood.  Follow Up Recommendations  SNF     Does the patient have the potential to tolerate intense rehabilitation     Barriers to Discharge        Equipment Recommendations  None recommended by PT    Recommendations for Other Services    Frequency 7X/week   Progress towards PT Goals Progress towards PT goals: Progressing toward goals  Plan Current plan remains appropriate    Precautions / Restrictions Precautions Precautions: Knee;Fall Required Braces or Orthoses: Knee Immobilizer - Left   Pertinent Vitals/Pain 3-4 l knee.    Mobility  Ambulation/Gait Ambulation/Gait Assistance Details: getting blood.    Exercises Total Joint Exercises Quad Sets: AROM;Left;10 reps;Supine Short Arc Quad: AAROM;Left;10 reps;Supine Heel Slides: AAROM;Left;10 reps;Supine Hip ABduction/ADduction: AAROM;Left;10 reps;Supine Straight Leg Raises: AAROM;Left;10 reps;Supine Goniometric ROM: 10-45    PT Diagnosis:    PT Problem List:   PT Treatment Interventions:     PT Goals (current goals can now be found in the care plan section)    Visit Information  Last PT Received On: 05/10/13 Assistance Needed: +1 History of Present Illness: LTKA 05/08/13    Subjective Data      Cognition       Balance     End of Session PT - End of Session Activity Tolerance: Patient tolerated treatment well Patient left: in bed Nurse Communication: Mobility status   GP     Rada Hay 05/10/2013, 1:04 PM

## 2013-05-10 NOTE — Progress Notes (Signed)
   Subjective: 2 Days Post-Op Procedure(s) (LRB): LEFT TOTAL KNEE ARTHROPLASTY (Left) Patient reports pain as mild and moderate.   Patient seen in rounds for Dr. Lequita Halt. His HGB was noted to be lower. He had some dizziness on day one with therapy.  Recommended blood.  Transfuse 2 units. Patient is well, but has had some minor complaints of pain in the knee, requiring pain medications Plan is to go Skilled nursing facility after hospital stay.  Objective: Vital signs in last 24 hours: Temp:  [97.8 F (36.6 C)-98.8 F (37.1 C)] 97.8 F (36.6 C) (12/10 1505) Pulse Rate:  [62-86] 62 (12/10 1505) Resp:  [15-16] 16 (12/10 1505) BP: (124-154)/(66-81) 126/69 mmHg (12/10 1505) SpO2:  [96 %] 96 % (12/10 0540)  Intake/Output from previous day:  Intake/Output Summary (Last 24 hours) at 05/10/13 1542 Last data filed at 05/10/13 1505  Gross per 24 hour  Intake 1212.5 ml  Output   2375 ml  Net -1162.5 ml    Intake/Output this shift: Total I/O In: 372.5 [P.O.:360; Blood:12.5] Out: 625 [Urine:625]  Labs:  Recent Labs  05/09/13 0511 05/10/13 0510  HGB 8.0* 7.5*    Recent Labs  05/09/13 0511 05/10/13 0510  WBC 4.6 4.8  RBC 2.31* 2.17*  HCT 23.1* 21.5*  PLT 128* 128*    Recent Labs  05/09/13 0511 05/10/13 0510  NA 133* 131*  K 3.9 3.8  CL 102 100  CO2 21 21  BUN 28* 18  CREATININE 1.40* 1.10  GLUCOSE 124* 105*  CALCIUM 7.7* 8.1*   No results found for this basename: LABPT, INR,  in the last 72 hours  EXAM General - Patient is Alert, Appropriate and Oriented Extremity - Neurovascular intact Sensation intact distally Dorsiflexion/Plantar flexion intact Dressing/Incision - clean, dry, no drainage Motor Function - intact, moving foot and toes well on exam.   Past Medical History  Diagnosis Date  . Hypertension   . DDD (degenerative disc disease), lumbar   . 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 Seizure     none  x 35 yrs    . Macular degeneration     left/ dry    Assessment/Plan: 2 Days Post-Op Procedure(s) (LRB): LEFT TOTAL KNEE ARTHROPLASTY (Left) Principal Problem:   OA (osteoarthritis) of knee Active Problems:   Hyponatremia   Acute blood loss anemia   Postop Transfusion  Estimated body mass index is 26.83 kg/(m^2) as calculated from the following:   Height as of this encounter: 5\' 10"  (1.778 m).   Weight as of this encounter: 84.823 kg (187 lb). Up with therapy Continue foley due to blood transfusion; will continue until blood transfusion complete  DVT Prophylaxis - Lovenox Weight-Bearing as tolerated to left leg Blood transfusion Recheck labs in AM  Clevie Prout 05/10/2013, 3:42 PM

## 2013-05-11 ENCOUNTER — Other Ambulatory Visit: Payer: Self-pay | Admitting: *Deleted

## 2013-05-11 LAB — CBC
HCT: 26.8 % — ABNORMAL LOW (ref 39.0–52.0)
Hemoglobin: 9.4 g/dL — ABNORMAL LOW (ref 13.0–17.0)
MCV: 93.1 fL (ref 78.0–100.0)
RBC: 2.88 MIL/uL — ABNORMAL LOW (ref 4.22–5.81)
RDW: 17.8 % — ABNORMAL HIGH (ref 11.5–15.5)
WBC: 6.4 10*3/uL (ref 4.0–10.5)

## 2013-05-11 LAB — TYPE AND SCREEN
ABO/RH(D): A POS
Antibody Screen: NEGATIVE
Unit division: 0
Unit division: 0

## 2013-05-11 LAB — BASIC METABOLIC PANEL
BUN: 14 mg/dL (ref 6–23)
CO2: 21 mEq/L (ref 19–32)
Calcium: 8.3 mg/dL — ABNORMAL LOW (ref 8.4–10.5)
Chloride: 98 mEq/L (ref 96–112)
GFR calc Af Amer: 72 mL/min — ABNORMAL LOW (ref >90)
GFR calc non Af Amer: 62 mL/min — ABNORMAL LOW (ref >90)
Glucose, Bld: 101 mg/dL — ABNORMAL HIGH (ref 70–99)
Potassium: 3.8 mEq/L (ref 3.5–5.1)
Sodium: 132 mEq/L — ABNORMAL LOW (ref 135–145)

## 2013-05-11 MED ORDER — METHOCARBAMOL 500 MG PO TABS: 500.0000 mg | ORAL_TABLET | Freq: Four times a day (QID) | ORAL | Status: DC | PRN

## 2013-05-11 MED ORDER — ACETAMINOPHEN 325 MG PO TABS: 650.0000 mg | ORAL_TABLET | Freq: Four times a day (QID) | ORAL | Status: AC | PRN

## 2013-05-11 MED ORDER — PHENOBARBITAL 32.4 MG PO TABS: ORAL_TABLET | ORAL | Status: AC

## 2013-05-11 MED ORDER — DSS 100 MG PO CAPS: 100.0000 mg | ORAL_CAPSULE | Freq: Two times a day (BID) | ORAL | Status: DC

## 2013-05-11 MED ORDER — OXYCODONE HCL 5 MG PO TABS: 5.0000 mg | ORAL_TABLET | ORAL | Status: DC | PRN

## 2013-05-11 MED ORDER — METOCLOPRAMIDE HCL 5 MG PO TABS: 5.0000 mg | ORAL_TABLET | Freq: Three times a day (TID) | ORAL | Status: DC | PRN

## 2013-05-11 MED ORDER — OXYCODONE HCL 5 MG PO TABS: ORAL_TABLET | ORAL | Status: DC

## 2013-05-11 MED ORDER — BISACODYL 10 MG RE SUPP: 10.0000 mg | Freq: Every day | RECTAL | Status: DC | PRN

## 2013-05-11 MED ORDER — ENOXAPARIN SODIUM 30 MG/0.3ML ~~LOC~~ SOLN: 30.0000 mg | Freq: Two times a day (BID) | SUBCUTANEOUS | Status: DC

## 2013-05-11 MED ORDER — TRAMADOL HCL 50 MG PO TABS: 50.0000 mg | ORAL_TABLET | Freq: Four times a day (QID) | ORAL | Status: DC | PRN

## 2013-05-11 MED ORDER — TRAMADOL HCL 50 MG PO TABS: ORAL_TABLET | ORAL | Status: DC

## 2013-05-11 MED ORDER — POLYETHYLENE GLYCOL 3350 17 G PO PACK: 17.0000 g | PACK | Freq: Every day | ORAL | Status: DC | PRN

## 2013-05-11 MED ORDER — POLYSACCHARIDE IRON COMPLEX 150 MG PO CAPS: 150.0000 mg | ORAL_CAPSULE | Freq: Two times a day (BID) | ORAL | Status: AC

## 2013-05-11 MED ORDER — ONDANSETRON HCL 4 MG PO TABS: 4.0000 mg | ORAL_TABLET | Freq: Four times a day (QID) | ORAL | Status: DC | PRN

## 2013-05-11 NOTE — Progress Notes (Signed)
Discharge summary sent to payer through [facility]  

## 2013-05-11 NOTE — Discharge Summary (Signed)
Physician Discharge Summary   Patient ID: [name] MRN: [id number] DOB/AGE: 08-22-1927 79 y.o.  Admit date: 05/08/2013 Discharge date: 05/11/2013  Primary Diagnosis:  Osteoarthritis Left knee(s)  Admission Diagnoses:  Past Medical History  Diagnosis Date  . Hypertension   . DDD (degenerative disc disease), lumbar   . 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 Seizure     none  x 35 yrs  . Macular degeneration     left/ dry   Discharge Diagnoses:   Principal Problem:   OA (osteoarthritis) of knee Active Problems:   Hyponatremia   Acute blood loss anemia   Postop Transfusion  Estimated body mass index is 26.83 kg/(m^2) as calculated from the following:   Height as of this encounter: 5\' 10"  (1.778 m).   Weight as of this encounter: 84.823 kg (187 lb).  Procedure:  Procedure(s) (LRB): LEFT TOTAL KNEE ARTHROPLASTY (Left)   Consults: None  HPI: [name] is a 79 y.o. year old male with end stage OA of his left knee with progressively worsening pain and dysfunction. He has constant pain, with activity and at rest and significant functional deficits with difficulties even with ADLs. He has had extensive non-op management including analgesics, injections of cortisone and viscosupplements, and home exercise program, but remains in significant pain with significant dysfunction. Radiographs show bone on bone arthritis medial with varus deformity. He presents now for left Total Knee Arthroplasty.   Laboratory Data: Admission on 05/08/2013  Component Date Value Range Status  . WBC 05/09/2013 4.6  4.0 - 10.5 K/uL Final  . RBC 05/09/2013 2.31* 4.22 - 5.81 MIL/uL Final  . Hemoglobin 05/09/2013 8.0* 13.0 - 17.0 g/dL Final  . HCT 56/21/3086 23.1* 39.0 - 52.0 % Final  . MCV 05/09/2013 100.0  78.0 - 100.0 fL Final  . MCH 05/09/2013 34.6* 26.0 - 34.0 pg Final  . MCHC 05/09/2013 34.6  30.0 - 36.0 g/dL Final  . RDW 57/84/6962 14.5  11.5 - 15.5 %  Final  . Platelets 05/09/2013 128* 150 - 400 K/uL Final  . Sodium 05/09/2013 133* 135 - 145 mEq/L Final  . Potassium 05/09/2013 3.9  3.5 - 5.1 mEq/L Final  . Chloride 05/09/2013 102  96 - 112 mEq/L Final  . CO2 05/09/2013 21  19 - 32 mEq/L Final  . Glucose, Bld 05/09/2013 124* 70 - 99 mg/dL Final  . BUN 95/28/4132 28* 6 - 23 mg/dL Final  . Creatinine, Ser 05/09/2013 1.40* 0.50 - 1.35 mg/dL Final  . Calcium 44/06/270 7.7* 8.4 - 10.5 mg/dL Final  . GFR calc non Af Amer 05/09/2013 45* >90 mL/min Final  . GFR calc Af Amer 05/09/2013 52* >90 mL/min Final   Comment: (NOTE)                          The eGFR has been calculated using the CKD EPI equation.                          This calculation has not been validated in all clinical situations.                          eGFR's persistently <90 mL/min signify possible Chronic Kidney  Disease.  . WBC 05/10/2013 4.8  4.0 - 10.5 K/uL Final  . RBC 05/10/2013 2.17* 4.22 - 5.81 MIL/uL Final  . Hemoglobin 05/10/2013 7.5* 13.0 - 17.0 g/dL Final  . HCT 16/03/9603 21.5* 39.0 - 52.0 % Final  . MCV 05/10/2013 99.1  78.0 - 100.0 fL Final  . MCH 05/10/2013 34.6* 26.0 - 34.0 pg Final  . MCHC 05/10/2013 34.9  30.0 - 36.0 g/dL Final  . RDW 54/01/8118 14.2  11.5 - 15.5 % Final  . Platelets 05/10/2013 128* 150 - 400 K/uL Final  . Sodium 05/10/2013 131* 135 - 145 mEq/L Final  . Potassium 05/10/2013 3.8  3.5 - 5.1 mEq/L Final  . Chloride 05/10/2013 100  96 - 112 mEq/L Final  . CO2 05/10/2013 21  19 - 32 mEq/L Final  . Glucose, Bld 05/10/2013 105* 70 - 99 mg/dL Final  . BUN 14/78/2956 18  6 - 23 mg/dL Final  . Creatinine, Ser 05/10/2013 1.10  0.50 - 1.35 mg/dL Final  . Calcium 21/30/8657 8.1* 8.4 - 10.5 mg/dL Final  . GFR calc non Af Amer 05/10/2013 60* >90 mL/min Final  . GFR calc Af Amer 05/10/2013 70* >90 mL/min Final   Comment: (NOTE)                          The eGFR has been calculated using the CKD EPI equation.                           This calculation has not been validated in all clinical situations.                          eGFR's persistently <90 mL/min signify possible Chronic Kidney                          Disease.  . Order Confirmation 05/10/2013 ORDER PROCESSED BY BLOOD BANK   Final  . WBC 05/11/2013 6.4  4.0 - 10.5 K/uL Final  . RBC 05/11/2013 2.88* 4.22 - 5.81 MIL/uL Final  . Hemoglobin 05/11/2013 9.4* 13.0 - 17.0 g/dL Final   Comment: DELTA CHECK NOTED                          POST TRANSFUSION SPECIMEN  . HCT 05/11/2013 26.8* 39.0 - 52.0 % Final  . MCV 05/11/2013 93.1  78.0 - 100.0 fL Final  . MCH 05/11/2013 32.6  26.0 - 34.0 pg Final  . MCHC 05/11/2013 35.1  30.0 - 36.0 g/dL Final  . RDW 84/69/6295 17.8* 11.5 - 15.5 % Final  . Platelets 05/11/2013 142* 150 - 400 K/uL Final  . Sodium 05/11/2013 132* 135 - 145 mEq/L Final  . Potassium 05/11/2013 3.8  3.5 - 5.1 mEq/L Final  . Chloride 05/11/2013 98  96 - 112 mEq/L Final  . CO2 05/11/2013 21  19 - 32 mEq/L Final  . Glucose, Bld 05/11/2013 101* 70 - 99 mg/dL Final  . BUN 28/41/3244 14  6 - 23 mg/dL Final  . Creatinine, Ser 05/11/2013 1.07  0.50 - 1.35 mg/dL Final  . Calcium 06/03/7251 8.3* 8.4 - 10.5 mg/dL Final  . GFR calc non Af Amer 05/11/2013 62* >90 mL/min Final  . GFR calc Af Amer 05/11/2013 72* >90 mL/min Final   Comment: (NOTE)  The eGFR has been calculated using the CKD EPI equation.                          This calculation has not been validated in all clinical situations.                          eGFR's persistently <90 mL/min signify possible Chronic Kidney                          Disease.  Hospital Outpatient Visit on 05/03/2013  Component Date Value Range Status  . aPTT 05/03/2013 32  24 - 37 seconds Final  . WBC 05/03/2013 5.4  4.0 - 10.5 K/uL Final  . RBC 05/03/2013 3.19* 4.22 - 5.81 MIL/uL Final  . Hemoglobin 05/03/2013 11.0* 13.0 - 17.0 g/dL Final  . HCT 16/03/9603 32.0* 39.0 - 52.0 % Final  . MCV  05/03/2013 100.3* 78.0 - 100.0 fL Final  . MCH 05/03/2013 34.5* 26.0 - 34.0 pg Final  . MCHC 05/03/2013 34.4  30.0 - 36.0 g/dL Final  . RDW 54/01/8118 14.5  11.5 - 15.5 % Final  . Platelets 05/03/2013 193  150 - 400 K/uL Final  . Sodium 05/03/2013 135  135 - 145 mEq/L Final  . Potassium 05/03/2013 4.8  3.5 - 5.1 mEq/L Final  . Chloride 05/03/2013 103  96 - 112 mEq/L Final  . CO2 05/03/2013 22  19 - 32 mEq/L Final  . Glucose, Bld 05/03/2013 96  70 - 99 mg/dL Final  . BUN 14/78/2956 40* 6 - 23 mg/dL Final  . Creatinine, Ser 05/03/2013 1.14  0.50 - 1.35 mg/dL Final  . Calcium 21/30/8657 9.2  8.4 - 10.5 mg/dL Final  . Total Protein 05/03/2013 7.1  6.0 - 8.3 g/dL Final  . Albumin 84/69/6295 3.9  3.5 - 5.2 g/dL Final  . AST 28/41/3244 22  0 - 37 U/L Final  . ALT 05/03/2013 11  0 - 53 U/L Final  . Alkaline Phosphatase 05/03/2013 96  39 - 117 U/L Final  . Total Bilirubin 05/03/2013 0.3  0.3 - 1.2 mg/dL Final  . GFR calc non Af Amer 05/03/2013 58* >90 mL/min Final  . GFR calc Af Amer 05/03/2013 67* >90 mL/min Final   Comment: (NOTE)                          The eGFR has been calculated using the CKD EPI equation.                          This calculation has not been validated in all clinical situations.                          eGFR's persistently <90 mL/min signify possible Chronic Kidney                          Disease.  Marland Kitchen Prothrombin Time 05/03/2013 12.9  11.6 - 15.2 seconds Final  . INR 05/03/2013 0.99  0.00 - 1.49 Final  . ABO/RH(D) 05/03/2013 A POS   Final  . Antibody Screen 05/03/2013 NEG   Final  . Sample Expiration 05/03/2013 05/11/2013   Final  . Unit Number 05/03/2013 [id number]   Final  . Blood Component Type 05/03/2013  RED CELLS,LR   Final  . Unit division 05/03/2013 00   Final  . Status of Unit 05/03/2013 ISSUED,FINAL   Final  . Transfusion Status 05/03/2013 OK TO TRANSFUSE   Final  . Crossmatch Result 05/03/2013 Compatible   Final  . Unit Number 05/03/2013  [id number]   Final  . Blood Component Type 05/03/2013 RED CELLS,LR   Final  . Unit division 05/03/2013 00   Final  . Status of Unit 05/03/2013 ISSUED,FINAL   Final  . Transfusion Status 05/03/2013 OK TO TRANSFUSE   Final  . Crossmatch Result 05/03/2013 Compatible   Final  . Color, Urine 05/03/2013 YELLOW  YELLOW Final  . APPearance 05/03/2013 CLEAR  CLEAR Final  . Specific Gravity, Urine 05/03/2013 1.026  1.005 - 1.030 Final  . pH 05/03/2013 5.5  5.0 - 8.0 Final  . Glucose, UA 05/03/2013 NEGATIVE  NEGATIVE mg/dL Final  . Hgb urine dipstick 05/03/2013 NEGATIVE  NEGATIVE Final  . Bilirubin Urine 05/03/2013 NEGATIVE  NEGATIVE Final  . Ketones, ur 05/03/2013 NEGATIVE  NEGATIVE mg/dL Final  . Protein, ur 56/21/3086 NEGATIVE  NEGATIVE mg/dL Final  . Urobilinogen, UA 05/03/2013 0.2  0.0 - 1.0 mg/dL Final  . Nitrite 57/84/6962 NEGATIVE  NEGATIVE Final  . Leukocytes, UA 05/03/2013 NEGATIVE  NEGATIVE Final   MICROSCOPIC NOT DONE ON URINES WITH NEGATIVE PROTEIN, BLOOD, LEUKOCYTES, NITRITE, OR GLUCOSE <1000 mg/dL.  Marland Kitchen MRSA, PCR 05/03/2013 NEGATIVE  NEGATIVE Final  . Staphylococcus aureus 05/03/2013 NEGATIVE  NEGATIVE Final   Comment:                                 The Xpert SA Assay (FDA                          approved for NASAL specimens                          in patients over 41 years of age),                          is one component of                          a comprehensive surveillance                          program.  Test performance has                          been validated by [employer] for patients greater                          than or equal to 4 year old.                          It is not intended                          to diagnose infection nor to  guide or monitor treatment.     X-Rays:Dg Chest 2 View  05/03/2013   CLINICAL DATA:  Hypertension, preop  EXAM: CHEST  2 VIEW  COMPARISON:  10/04/2007  FINDINGS:  Normal cardiac silhouette. Costophrenic angles are clear. No effusion, infiltrate, or pneumothorax. Degenerative osteophytosis of the thoracic spine.  IMPRESSION: No acute cardiopulmonary process.   Electronically Signed   By: Genevive Bi M.D.   On: 05/03/2013 13:24    EKG: Orders placed during the hospital encounter of 05/03/13  . EKG 12-LEAD  . EKG 12-LEAD     Hospital Course: [name] is a 79 y.o. who was admitted to [facility]. They were brought to the operating room on 05/08/2013 and underwent Procedure(s): LEFT TOTAL KNEE ARTHROPLASTY.  Patient tolerated the procedure well and was later transferred to the recovery room and then to the orthopaedic floor for postoperative care.  They were given PO and IV analgesics for pain control following their surgery.  They were given 24 hours of postoperative antibiotics of  Anti-infectives   Start     Dose/Rate Route Frequency Ordered Stop   05/08/13 1900  ceFAZolin (ANCEF) IVPB 2 g/50 mL premix     2 g 100 mL/hr over 30 Minutes Intravenous Every 6 hours 05/08/13 1554 05/09/13 0101   05/08/13 1000  ceFAZolin (ANCEF) IVPB 2 g/50 mL premix     2 g 100 mL/hr over 30 Minutes Intravenous On call to O.R. 05/08/13 1610 05/08/13 1255     and started on DVT prophylaxis in the form of Lovenox.   PT and OT were ordered for total joint protocol.  Discharge planning consulted to help with postop disposition and equipment needs.  Patient had a tough night on the evening of surgery with pain.  They started to get up OOB with therapy on day one. Hemovac drain was pulled without difficulty.  Continued to work with therapy into day two.  Patient seen in rounds for Dr. Lequita Halt. His HGB was noted to be lower. He had some dizziness on day one with therapy. Recommended blood. Transfused 2 units. Dressing was changed on day two and the incision was healing well.  By day three, the patient had progressed with therapy and meeting their goals.  Incision was  healing well.  Patient was seen in rounds on day three.  He felt better after the blood and was ready to go to [facility].  Discharge Medications: Prior to Admission medications   Medication Sig Start Date End Date Taking? Authorizing Provider  amLODipine (NORVASC) 5 MG tablet Take 5 mg by mouth daily with breakfast.   Yes Historical Provider, MD  metoprolol tartrate (LOPRESSOR) 25 MG tablet Take 25 mg by mouth 2 (two) times daily.    Yes Historical Provider, MD  PHENobarbital (LUMINAL) 32.4 MG tablet Take 32.4-64.8 mg by mouth 2 (two) times daily. Takes 64.8 mg qAM and 32.4 mg qPM.   Yes Historical Provider, MD  phenytoin (DILANTIN) 100 MG ER capsule Take 200 mg by mouth 2 (two) times daily.    Yes Historical Provider, MD  acetaminophen (TYLENOL) 325 MG tablet Take 2 tablets (650 mg total) by mouth every 6 (six) hours as needed for mild pain (or Fever >/= 101). 05/11/13   Toni Demo Julien Girt, PA-C  bisacodyl (DULCOLAX) 10 MG suppository Place 1 suppository (10 mg total) rectally daily as needed for moderate constipation. 05/11/13   Lelani Garnett, PA-C  docusate sodium 100 MG CAPS Take 100 mg by mouth 2 (two)  times daily. 05/11/13   Deletha Jaffee, PA-C  enoxaparin (LOVENOX) 30 MG/0.3ML injection Inject 0.3 mLs (30 mg total) into the skin every 12 (twelve) hours. Continue Lovenox for one more week and then take a full dose Aspirin 325 mg daily for three more weeks, then discontinue. 05/11/13   Abigial Newville Julien Girt, PA-C  iron polysaccharides (NIFEREX) 150 MG capsule Take 1 capsule (150 mg total) by mouth 2 (two) times daily. 05/11/13   Richa Shor Julien Girt, PA-C  methocarbamol (ROBAXIN) 500 MG tablet Take 1 tablet (500 mg total) by mouth every 6 (six) hours as needed for muscle spasms. 05/11/13   Estevan Kersh Julien Girt, PA-C  metoCLOPramide (REGLAN) 5 MG tablet Take 1-2 tablets (5-10 mg total) by mouth every 8 (eight) hours as needed for nausea (if ondansetron (ZOFRAN) ineffective.). 05/11/13    Savvy Peeters, PA-C  ondansetron (ZOFRAN) 4 MG tablet Take 1 tablet (4 mg total) by mouth every 6 (six) hours as needed for nausea. 05/11/13   Genova Kiner, PA-C  oxyCODONE (OXY IR/ROXICODONE) 5 MG immediate release tablet Take 1-2 tablets (5-10 mg total) by mouth every 3 (three) hours as needed for breakthrough pain. 05/11/13   Jujuan Dugo, PA-C  polyethylene glycol (MIRALAX / GLYCOLAX) packet Take 17 g by mouth daily as needed for mild constipation. 05/11/13   Drenda Sobecki, PA-C  traMADol (ULTRAM) 50 MG tablet Take 1-2 tablets (50-100 mg total) by mouth every 6 (six) hours as needed (mild pain). 05/11/13   Masin Shatto Julien Girt, PA-C   Discharge to SNF  Diet - Cardiac diet  Follow up - in 2 weeks  Activity - WBAT  Disposition - Skilled nursing facility - [facility]  Condition Upon Discharge - Good  D/C Meds - See DC Summary  DVT Prophylaxis - Lovenox   Discharge Orders   Future Orders Complete By Expires   Call MD / Call 911  As directed    Comments:     If you experience chest pain or shortness of breath, CALL 911 and be transported to the hospital emergency room.  If you develope a fever above 101 F, pus (white drainage) or increased drainage or redness at the wound, or calf pain, call your surgeon's office.   Change dressing  As directed    Comments:     Change dressing daily with sterile 4 x 4 inch gauze dressing and apply TED hose. Do not submerge the incision under water.   Constipation Prevention  As directed    Comments:     Drink plenty of fluids.  Prune juice may be helpful.  You may use a stool softener, such as Colace (over the counter) 100 mg twice a day.  Use MiraLax (over the counter) for constipation as needed.   Diet - low sodium heart healthy  As directed    Discharge instructions  As directed    Comments:     Pick up stool softner and laxative for home. Do not submerge incision under water. May shower starting Thursday  05/11/2013 Continue to use ice for pain and swelling from surgery.  Take Lovenox daily for one more week and then take full dose enteric coated Aspirin 325 for three more weeks.  When discharged from the skilled rehab facility, please have the facility set up the patient's Home Health Physical Therapy prior to being released.  Also provide the patient with their medications at time of release from the facility to include their pain medication, the muscle relaxants, and their blood thinner medication.  If the  patient is still at the rehab facility at time of follow up appointment, please also assist the patient in arranging follow up appointment in our office and any transportation needs.   Do not put a pillow under the knee. Place it under the heel.  As directed    Do not sit on low chairs, stoools or toilet seats, as it may be difficult to get up from low surfaces  As directed    Driving restrictions  As directed    Comments:     No driving until released by the physician.   Increase activity slowly as tolerated  As directed    Lifting restrictions  As directed    Comments:     No lifting until released by the physician.   Patient may shower  As directed    Comments:     You may shower without a dressing once there is no drainage.  Do not wash over the wound.  If drainage remains, do not shower until drainage stops.   TED hose  As directed    Comments:     Use stockings (TED hose) for 3 weeks on both leg(s).  You may remove them at night for sleeping.   Weight bearing as tolerated  As directed    Questions:     Laterality:     Extremity:         Medication List    STOP taking these medications       ICAPS PO     naproxen sodium 220 MG tablet  Commonly known as:  ANAPROX     vitamin B-12 1000 MCG tablet  Commonly known as:  CYANOCOBALAMIN      TAKE these medications       acetaminophen 325 MG tablet  Commonly known as:  TYLENOL  Take 2 tablets (650 mg total) by mouth  every 6 (six) hours as needed for mild pain (or Fever >/= 101).     amLODipine 5 MG tablet  Commonly known as:  NORVASC  Take 5 mg by mouth daily with breakfast.     bisacodyl 10 MG suppository  Commonly known as:  DULCOLAX  Place 1 suppository (10 mg total) rectally daily as needed for moderate constipation.     DSS 100 MG Caps  Take 100 mg by mouth 2 (two) times daily.     enoxaparin 30 MG/0.3ML injection  Commonly known as:  LOVENOX  Inject 0.3 mLs (30 mg total) into the skin every 12 (twelve) hours. Continue Lovenox for one more week and then take a full dose Aspirin 325 mg daily for three more weeks, then discontinue.     iron polysaccharides 150 MG capsule  Commonly known as:  NIFEREX  Take 1 capsule (150 mg total) by mouth 2 (two) times daily.     methocarbamol 500 MG tablet  Commonly known as:  ROBAXIN  Take 1 tablet (500 mg total) by mouth every 6 (six) hours as needed for muscle spasms.     metoCLOPramide 5 MG tablet  Commonly known as:  REGLAN  Take 1-2 tablets (5-10 mg total) by mouth every 8 (eight) hours as needed for nausea (if ondansetron (ZOFRAN) ineffective.).     metoprolol tartrate 25 MG tablet  Commonly known as:  LOPRESSOR  Take 25 mg by mouth 2 (two) times daily.     ondansetron 4 MG tablet  Commonly known as:  ZOFRAN  Take 1 tablet (4 mg total) by mouth every 6 (six) hours  as needed for nausea.     oxyCODONE 5 MG immediate release tablet  Commonly known as:  Oxy IR/ROXICODONE  Take 1-2 tablets (5-10 mg total) by mouth every 3 (three) hours as needed for breakthrough pain.     PHENobarbital 32.4 MG tablet  Commonly known as:  LUMINAL  Take 32.4-64.8 mg by mouth 2 (two) times daily. Takes 64.8 mg qAM and 32.4 mg qPM.     phenytoin 100 MG ER capsule  Commonly known as:  DILANTIN  Take 200 mg by mouth 2 (two) times daily.     polyethylene glycol packet  Commonly known as:  MIRALAX / GLYCOLAX  Take 17 g by mouth daily as needed for mild  constipation.     traMADol 50 MG tablet  Commonly known as:  ULTRAM  Take 1-2 tablets (50-100 mg total) by mouth every 6 (six) hours as needed (mild pain).           Follow-up Information   Follow up with Loanne Drilling, MD. Schedule an appointment as soon as possible for a visit on 05/23/2013. (Call office for appointment time.)    Specialty:  Orthopedic Surgery   Contact information:   [address] [place] [address] [phone]       Signed: Patrica Duel 05/11/2013, 8:55 AM

## 2013-05-11 NOTE — Progress Notes (Signed)
Clinical Social Work Department CLINICAL SOCIAL WORK PLACEMENT NOTE 05/11/2013  Patient:  [name], [name]  Account Number:  [id number] Admit date:  05/08/2013  Clinical Social Worker:  Cori Razor, LCSW  Date/time:  05/09/2013 01:37 PM  Clinical Social Work is seeking post-discharge placement for this patient at the following level of care:   SKILLED NURSING   (*CSW will update this form in Epic as items are completed)     Patient/family provided with [facility] Department of Clinical Social Work's list of facilities offering this level of care within the geographic area requested by the patient (or if unable, by the patient's family).  05/09/2013  Patient/family informed of their freedom to choose among providers that offer the needed level of care, that participate in Medicare, Medicaid or managed care program needed by the patient, have an available bed and are willing to accept the patient.    Patient/family informed of [facility]' ownership interest in [facility], as well as of the fact that they are under no obligation to receive care at this facility.  PASARR submitted to EDS on 05/09/2013 PASARR number received from EDS on 05/09/2013  FL2 transmitted to all facilities in geographic area requested by pt/family on  05/09/2013 FL2 transmitted to all facilities within larger geographic area on   Patient informed that his/her managed care company has contracts with or will negotiate with  certain facilities, including the following:     Patient/family informed of bed offers received:  05/09/2013 Patient chooses bed at [facility] Physician recommends and patient chooses bed at    Patient to be transferred to [facility] on  05/11/2013 Patient to be transferred to facility by FAMILY  The following physician request were entered in Epic:   Additional Comments:  Cori Razor LCSW [phone]

## 2013-05-11 NOTE — Progress Notes (Signed)
   Subjective: 3 Days Post-Op Procedure(s) (LRB): LEFT TOTAL KNEE ARTHROPLASTY (Left) Patient reports pain as mild.   Patient seen in rounds with Dr. Lequita Halt. He is feeling better today after the blood. Patient is well, and has had no acute complaints or problems Patient is ready to go to [facility] today.  Objective: Vital signs in last 24 hours: Temp:  [97.6 F (36.4 C)-98.7 F (37.1 C)] 98.5 F (36.9 C) (12/11 0601) Pulse Rate:  [62-101] 80 (12/11 0601) Resp:  [15-18] 16 (12/11 0601) BP: (124-171)/(64-89) 167/71 mmHg (12/11 0601) SpO2:  [93 %-95 %] 93 % (12/11 0601)  Intake/Output from previous day:  Intake/Output Summary (Last 24 hours) at 05/11/13 0839 Last data filed at 05/11/13 0755  Gross per 24 hour  Intake 1728.33 ml  Output   1800 ml  Net -71.67 ml    Intake/Output this shift: Total I/O In: 240 [P.O.:240] Out: -   Labs:  Recent Labs  05/09/13 0511 05/10/13 0510 05/11/13 0518  HGB 8.0* 7.5* 9.4*    Recent Labs  05/10/13 0510 05/11/13 0518  WBC 4.8 6.4  RBC 2.17* 2.88*  HCT 21.5* 26.8*  PLT 128* 142*    Recent Labs  05/10/13 0510 05/11/13 0518  NA 131* 132*  K 3.8 3.8  CL 100 98  CO2 21 21  BUN 18 14  CREATININE 1.10 1.07  GLUCOSE 105* 101*  CALCIUM 8.1* 8.3*   No results found for this basename: LABPT, INR,  in the last 72 hours  EXAM: General - Patient is Alert, Appropriate and Oriented Extremity - Neurovascular intact Sensation intact distally Dorsiflexion/Plantar flexion intact Incision - clean, dry, no drainage, healing Motor Function - intact, moving foot and toes well on exam.   Assessment/Plan: 3 Days Post-Op Procedure(s) (LRB): LEFT TOTAL KNEE ARTHROPLASTY (Left) Procedure(s) (LRB): LEFT TOTAL KNEE ARTHROPLASTY (Left) Past Medical History  Diagnosis Date  . Hypertension   . DDD (degenerative disc disease), lumbar   . 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 Seizure     none  x  35 yrs  . Macular degeneration     left/ dry   Principal Problem:   OA (osteoarthritis) of knee Active Problems:   Hyponatremia   Acute blood loss anemia   Postop Transfusion  Estimated body mass index is 26.83 kg/(m^2) as calculated from the following:   Height as of this encounter: 5\' 10"  (1.778 m).   Weight as of this encounter: 84.823 kg (187 lb). Up with therapy Discharge to SNF Diet - Cardiac diet Follow up - in 2 weeks Activity - WBAT Disposition - Skilled nursing facility - [facility] Condition Upon Discharge - Good D/C Meds - See DC Summary DVT Prophylaxis - Lovenox  PERKINS, ALEXZANDREW 05/11/2013, 8:39 AM

## 2013-05-17 ENCOUNTER — Non-Acute Institutional Stay (SKILLED_NURSING_FACILITY): Payer: Medicare Other | Admitting: Internal Medicine

## 2013-05-17 DIAGNOSIS — D62 Acute posthemorrhagic anemia: Secondary | ICD-10-CM

## 2013-05-17 DIAGNOSIS — M1710 Unilateral primary osteoarthritis, unspecified knee: Secondary | ICD-10-CM

## 2013-05-17 DIAGNOSIS — R569 Unspecified convulsions: Secondary | ICD-10-CM

## 2013-05-17 DIAGNOSIS — I10 Essential (primary) hypertension: Secondary | ICD-10-CM

## 2013-05-18 ENCOUNTER — Non-Acute Institutional Stay (SKILLED_NURSING_FACILITY): Payer: Medicare Other | Admitting: Adult Health

## 2013-05-18 DIAGNOSIS — I10 Essential (primary) hypertension: Secondary | ICD-10-CM

## 2013-05-18 DIAGNOSIS — M1710 Unilateral primary osteoarthritis, unspecified knee: Secondary | ICD-10-CM

## 2013-05-18 DIAGNOSIS — R569 Unspecified convulsions: Secondary | ICD-10-CM

## 2013-05-18 DIAGNOSIS — M179 Osteoarthritis of knee, unspecified: Secondary | ICD-10-CM

## 2013-05-18 DIAGNOSIS — D62 Acute posthemorrhagic anemia: Secondary | ICD-10-CM

## 2013-05-18 DIAGNOSIS — K5900 Constipation, unspecified: Secondary | ICD-10-CM

## 2013-05-18 NOTE — Progress Notes (Signed)
Patient ID: [name], male   DOB: [birth year], 79 y.o.   MRN: [id number]              PROGRESS NOTE  DATE: 05/18/2013   FACILITY: [facility]  LEVEL OF CARE: SNF (31)  Acute Visit  CHIEF COMPLAINT:  Discharge Notes  HISTORY OF PRESENT ILLNESS: This is an 79 year old male who is for discharge home with Home health PT and Nursing. He has been admitted to [facility] on 05/11/13 from [facility] with Osteoarthritis S/P Left total knee arthroplasty. Patient was admitted to this facility for short-term rehabilitation after the patient's recent hospitalization.  Patient has completed SNF rehabilitation and therapy has cleared the patient for discharge.  Reassessment of ongoing problem(s):  HTN: Pt 's HTN remains stable.  Denies CP, sob, DOE, pedal edema, headaches, dizziness or visual disturbances.  No complications from the medications currently being used.  Last BP : 110/60  SEIZURE DISORDER: The patient's seizure disorder remains stable. No complications reported from the medications presently being used. Staff do not report any recent seizure activity.  ANEMIA: The anemia has been stable. The patient denies fatigue, melena or hematochezia. No complications from the medications currently being used. 12/14  hgb 9.4  PAST MEDICAL HISTORY : Reviewed.  No changes.  CURRENT MEDICATIONS: Reviewed per [facility]  REVIEW OF SYSTEMS:  GENERAL: no change in appetite, no fatigue, no weight changes, no fever, chills or weakness RESPIRATORY: no cough, SOB, DOE, wheezing, hemoptysis CARDIAC: no chest pain, edema or palpitations GI: no abdominal pain, diarrhea, constipation, heart burn, nausea or vomiting  PHYSICAL EXAMINATION  VS:  T98.6       P90      RR16      BP110/60         WT188.2 (Lb)  GENERAL: no acute distress, normal body habitus EYES: conjunctivae normal, sclerae normal, normal eye lids NECK: supple, trachea midline, no neck masses, no thyroid tenderness,  no thyromegaly LYMPHATICS: no LAN in the neck, no supraclavicular LAN RESPIRATORY: breathing is even & unlabored, BS CTAB CARDIAC: RRR, no murmur,no extra heart sounds, no edema GI: abdomen soft, normal BS, no masses, no tenderness, no hepatomegaly, no splenomegaly PSYCHIATRIC: the patient is alert & oriented to person, affect & behavior appropriate  LABS/RADIOLOGY: 05/11/13 WBC 6.4 hemoglobin 9.4 hematocrit 26.8 sodium 132 potassium 3.8 glucose 101 BUN 14 creatinine 1.07 calcium 8.3   ASSESSMENT/PLAN:  Osteoarthritis status post left total knee arthroplasty -  For Home health PT and Nursing  Hypertension - well controlled; continue Norvasc and Lopressor  Seizure - well controlled; continue Dilantin and phenobarbital  Anemia - stable; continue Niferex  Constipation - no complaints; continue Colace and MiraLax   I have filled out patient's discharge paperwork and written prescriptions.  Patient will receive home health PT and OT.  Total discharge time: Less than 30 minutes Discharge time involved coordination of the discharge process with Child psychotherapist, nursing staff and therapy department. Medical justification for home health services verified.  CPT CODE: 40981

## 2013-05-21 ENCOUNTER — Encounter: Payer: Self-pay | Admitting: Internal Medicine

## 2013-05-21 NOTE — Progress Notes (Signed)
HISTORY & PHYSICAL  DATE: 05/17/2013   FACILITY: [facility]  LEVEL OF CARE: SNF (31)  ALLERGIES:  No Known Allergies  CHIEF COMPLAINT:  Manage left knee osteoarthritis, hypertension and seizure disorder  HISTORY OF PRESENT ILLNESS: Patient is an 79 year old Caucasian male.  KNEE OSTEOARTHRITIS: Patient had a history of pain and functional disability in the knee due to end-stage osteoarthritis and has failed nonsurgical conservative treatments. Patient had worsening of pain with activity and weight bearing, pain that interfered with activities of daily living & pain with passive range of motion. Therefore patient underwent total knee arthroplasty and tolerated the procedure well. Patient is admitted to this facility for sort short-term rehabilitation. Patient denies knee pain. Radiographs showed bone-on-bone arthritis in the medial compartment varus deformity.  SEIZURE DISORDER: The patient's seizure disorder remains stable. No complications reported from the medications presently being used. Staff do not report any recent seizure activity.  HTN: Pt 's HTN remains stable.  Denies CP, sob, DOE, pedal edema, headaches, dizziness or visual disturbances.  No complications from the medications currently being used.  Last BP : 1 4473, 150/80, 162/84.  PAST MEDICAL HISTORY :  Past Medical History  Diagnosis Date  . Hypertension   . DDD (degenerative disc disease), lumbar   . 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 Seizure     none  x 35 yrs  . Macular degeneration     left/ dry    PAST SURGICAL HISTORY: Past Surgical History  Procedure Laterality Date  . Colostomy    . Joint replacement Right 2009    knee  . Tonsillectomy    . Insertion prostate radiation seed  2002  . Mohs surgery Left 9/14    with radiation  . Eye surgery Bilateral     bilateral cataract extraction with IOL  . Colon surgery    . Hydrocelectomy    .  Total knee arthroplasty Left 05/08/2013    Procedure: LEFT TOTAL KNEE ARTHROPLASTY;  Surgeon: Loanne Drilling, MD;  Location: [facility];  Service: Orthopedics;  Laterality: Left;    SOCIAL HISTORY:  reports that he has quit smoking. His smoking use included Cigarettes. He smoked 0.50 packs per day. He has never used smokeless tobacco. He reports that he drinks alcohol. He reports that he does not use illicit drugs.  FAMILY HISTORY: none  CURRENT MEDICATIONS: Reviewed per [facility]  REVIEW OF SYSTEMS:  See HPI otherwise 14 point ROS is negative.  PHYSICAL EXAMINATION  VS:  T 96.9       P 82       RR 16       BP 1 4473        GENERAL: no acute distress, normal body habitus EYES: conjunctivae normal, sclerae normal, normal eye lids MOUTH/THROAT: lips without lesions,no lesions in the mouth,tongue is without lesions,uvula elevates in midline NECK: supple, trachea midline, no neck masses, no thyroid tenderness, no thyromegaly LYMPHATICS: no LAN in the neck, no supraclavicular LAN RESPIRATORY: breathing is even & unlabored, BS CTAB CARDIAC: RRR, no murmur,no extra heart sounds, no edema GI:  ABDOMEN: abdomen soft, normal BS, no masses, no tenderness  LIVER/SPLEEN: no hepatomegaly, no splenomegaly MUSCULOSKELETAL: HEAD: normal to inspection & palpation BACK: no kyphosis, scoliosis or spinal processes tenderness EXTREMITIES: LEFT UPPER EXTREMITY: full range of motion, normal strength & tone RIGHT UPPER EXTREMITY:  full range of motion, normal strength & tone LEFT LOWER EXTREMITY:  range of motion not tested due to surgery, normal strength & tone RIGHT LOWER EXTREMITY:  full range of motion, normal strength & tone PSYCHIATRIC: the patient is alert & oriented to person, affect & behavior appropriate  LABS/RADIOLOGY:  Labs reviewed: Basic Metabolic Panel:  Recent Labs  96/04/54 0511 05/10/13 0510 05/11/13 0518  NA 133* 131* 132*  K 3.9 3.8 3.8  CL 102 100 98  CO2 21 21 21   GLUCOSE 124*  105* 101*  BUN 28* 18 14  CREATININE 1.40* 1.10 1.07  CALCIUM 7.7* 8.1* 8.3*   Liver Function Tests:  Recent Labs  05/03/13 1120  AST 22  ALT 11  ALKPHOS 96  BILITOT 0.3  PROT 7.1  ALBUMIN 3.9   CBC:  Recent Labs  05/09/13 0511 05/10/13 0510 05/11/13 0518  WBC 4.6 4.8 6.4  HGB 8.0* 7.5* 9.4*  HCT 23.1* 21.5* 26.8*  MCV 100.0 99.1 93.1  PLT 128* 128* 142*   Chest x-ray-no acute disease Urinalysis-negative Mersol by PCR negative Staphylococcus aureus by PCR negative  ASSESSMENT/PLAN:  Left knee osteoarthritis-status post left total knee arthroplasty. pt has done well with rehabilitation. Plan for discharge tomorrow. Hypertension-uncontrolled. Increase Norvasc to 10 mg daily Seizure disorder-well-controlled Acute blood loss anemia-followup with primary M.D. continue iron. Constipation-well-controlled  I have reviewed patient's medical records received at admission/from hospitalization.  CPT CODE: 09811

## 2013-10-04 ENCOUNTER — Other Ambulatory Visit: Payer: Self-pay | Admitting: Dermatology

## 2013-11-09 ENCOUNTER — Other Ambulatory Visit: Payer: Self-pay | Admitting: Dermatology

## 2014-05-07 ENCOUNTER — Other Ambulatory Visit: Payer: Self-pay | Admitting: Physician Assistant

## 2014-05-07 DIAGNOSIS — R6881 Early satiety: Secondary | ICD-10-CM

## 2014-05-07 DIAGNOSIS — Z86002 Personal history of in-situ neoplasm of other and unspecified genital organs: Secondary | ICD-10-CM

## 2014-05-07 DIAGNOSIS — D509 Iron deficiency anemia, unspecified: Secondary | ICD-10-CM

## 2014-05-07 DIAGNOSIS — R1084 Generalized abdominal pain: Secondary | ICD-10-CM

## 2014-05-16 ENCOUNTER — Ambulatory Visit
Admission: RE | Admit: 2014-05-16 | Discharge: 2014-05-16 | Disposition: A | Discharge status: Home / Self Care | Payer: Commercial Managed Care - HMO | Source: Ambulatory Visit | Attending: Physician Assistant | Admitting: Physician Assistant

## 2014-05-16 DIAGNOSIS — R6881 Early satiety: Secondary | ICD-10-CM

## 2014-05-16 DIAGNOSIS — Z86002 Personal history of in-situ neoplasm of other and unspecified genital organs: Secondary | ICD-10-CM

## 2014-05-16 DIAGNOSIS — D509 Iron deficiency anemia, unspecified: Secondary | ICD-10-CM

## 2014-05-16 DIAGNOSIS — R1084 Generalized abdominal pain: Secondary | ICD-10-CM

## 2014-05-16 MED ORDER — IOHEXOL 300 MG/ML  SOLN
100.0000 mL | Freq: Once | INTRAMUSCULAR | Status: AC | PRN
  Administered 2014-05-16: 100 mL via INTRAVENOUS

## 2014-05-18 ENCOUNTER — Other Ambulatory Visit: Payer: Self-pay | Admitting: Gastroenterology

## 2014-05-18 DIAGNOSIS — R9389 Abnormal findings on diagnostic imaging of other specified body structures: Secondary | ICD-10-CM

## 2014-05-26 ENCOUNTER — Ambulatory Visit
Admission: RE | Admit: 2014-05-26 | Discharge: 2014-05-26 | Disposition: A | Discharge status: Home / Self Care | Payer: Commercial Managed Care - HMO | Source: Ambulatory Visit | Attending: Gastroenterology | Admitting: Gastroenterology

## 2014-05-26 DIAGNOSIS — R9389 Abnormal findings on diagnostic imaging of other specified body structures: Secondary | ICD-10-CM

## 2014-05-26 MED ORDER — GADOBENATE DIMEGLUMINE 529 MG/ML IV SOLN
17.0000 mL | Freq: Once | INTRAVENOUS | Status: AC | PRN
  Administered 2014-05-26: 17 mL via INTRAVENOUS

## 2014-05-28 ENCOUNTER — Other Ambulatory Visit (HOSPITAL_COMMUNITY): Payer: Self-pay | Admitting: Gastroenterology

## 2014-05-28 DIAGNOSIS — K769 Liver disease, unspecified: Secondary | ICD-10-CM

## 2014-05-29 ENCOUNTER — Other Ambulatory Visit: Payer: Self-pay | Admitting: Radiology

## 2014-05-30 ENCOUNTER — Ambulatory Visit (HOSPITAL_COMMUNITY)
Admission: RE | Admit: 2014-05-30 | Discharge: 2014-05-30 | Disposition: A | Discharge status: Home / Self Care | Payer: Commercial Managed Care - HMO | Source: Ambulatory Visit | Attending: Gastroenterology | Admitting: Gastroenterology

## 2014-05-30 DIAGNOSIS — I700 Atherosclerosis of aorta: Secondary | ICD-10-CM | POA: Diagnosis not present

## 2014-05-30 DIAGNOSIS — K439 Ventral hernia without obstruction or gangrene: Secondary | ICD-10-CM | POA: Diagnosis not present

## 2014-05-30 DIAGNOSIS — Z85820 Personal history of malignant melanoma of skin: Secondary | ICD-10-CM | POA: Diagnosis not present

## 2014-05-30 DIAGNOSIS — D649 Anemia, unspecified: Secondary | ICD-10-CM | POA: Insufficient documentation

## 2014-05-30 DIAGNOSIS — N281 Cyst of kidney, acquired: Secondary | ICD-10-CM | POA: Diagnosis not present

## 2014-05-30 DIAGNOSIS — R5383 Other fatigue: Secondary | ICD-10-CM | POA: Diagnosis not present

## 2014-05-30 DIAGNOSIS — Z8546 Personal history of malignant neoplasm of prostate: Secondary | ICD-10-CM | POA: Diagnosis not present

## 2014-05-30 DIAGNOSIS — Z79899 Other long term (current) drug therapy: Secondary | ICD-10-CM | POA: Diagnosis not present

## 2014-05-30 DIAGNOSIS — R599 Enlarged lymph nodes, unspecified: Secondary | ICD-10-CM | POA: Diagnosis not present

## 2014-05-30 DIAGNOSIS — R160 Hepatomegaly, not elsewhere classified: Secondary | ICD-10-CM | POA: Diagnosis present

## 2014-05-30 DIAGNOSIS — Z87891 Personal history of nicotine dependence: Secondary | ICD-10-CM | POA: Insufficient documentation

## 2014-05-30 DIAGNOSIS — R0600 Dyspnea, unspecified: Secondary | ICD-10-CM | POA: Diagnosis not present

## 2014-05-30 DIAGNOSIS — Z6824 Body mass index (BMI) 24.0-24.9, adult: Secondary | ICD-10-CM | POA: Diagnosis not present

## 2014-05-30 DIAGNOSIS — R630 Anorexia: Secondary | ICD-10-CM | POA: Insufficient documentation

## 2014-05-30 DIAGNOSIS — D72829 Elevated white blood cell count, unspecified: Secondary | ICD-10-CM | POA: Diagnosis not present

## 2014-05-30 DIAGNOSIS — K769 Liver disease, unspecified: Secondary | ICD-10-CM

## 2014-05-30 DIAGNOSIS — I10 Essential (primary) hypertension: Secondary | ICD-10-CM | POA: Diagnosis not present

## 2014-05-30 DIAGNOSIS — Z7901 Long term (current) use of anticoagulants: Secondary | ICD-10-CM | POA: Diagnosis not present

## 2014-05-30 DIAGNOSIS — Z933 Colostomy status: Secondary | ICD-10-CM | POA: Insufficient documentation

## 2014-05-30 DIAGNOSIS — I81 Portal vein thrombosis: Secondary | ICD-10-CM | POA: Insufficient documentation

## 2014-05-30 LAB — PROTIME-INR
INR: 1.21 (ref 0.00–1.49)
Prothrombin Time: 15.4 seconds — ABNORMAL HIGH (ref 11.6–15.2)

## 2014-05-30 LAB — APTT: APTT: 37 s (ref 24–37)

## 2014-05-30 LAB — CBC
HCT: 27.4 % — ABNORMAL LOW (ref 39.0–52.0)
HEMOGLOBIN: 9.2 g/dL — AB (ref 13.0–17.0)
MCH: 32.6 pg (ref 26.0–34.0)
MCHC: 33.6 g/dL (ref 30.0–36.0)
MCV: 97.2 fL (ref 78.0–100.0)
Platelets: 358 10*3/uL (ref 150–400)
RBC: 2.82 MIL/uL — AB (ref 4.22–5.81)
RDW: 13 % (ref 11.5–15.5)
WBC: 12.4 10*3/uL — ABNORMAL HIGH (ref 4.0–10.5)

## 2014-05-30 MED ORDER — MIDAZOLAM HCL 2 MG/2ML IJ SOLN
INTRAMUSCULAR | Status: AC
  Filled 2014-05-30: qty 6

## 2014-05-30 MED ORDER — MIDAZOLAM HCL 2 MG/2ML IJ SOLN
INTRAMUSCULAR | Status: AC | PRN
  Administered 2014-05-30: 1 mg via INTRAVENOUS
  Administered 2014-05-30 (×2): 0.5 mg via INTRAVENOUS
  Administered 2014-05-30 (×2): 1 mg via INTRAVENOUS

## 2014-05-30 MED ORDER — SODIUM CHLORIDE 0.9 % IV SOLN
INTRAVENOUS | Status: DC
  Administered 2014-05-30: 11:00:00 via INTRAVENOUS

## 2014-05-30 MED ORDER — FLUMAZENIL 0.5 MG/5ML IV SOLN
INTRAVENOUS | Status: AC
  Filled 2014-05-30: qty 5

## 2014-05-30 MED ORDER — NALOXONE HCL 0.4 MG/ML IJ SOLN
INTRAMUSCULAR | Status: AC
  Filled 2014-05-30: qty 1

## 2014-05-30 MED ORDER — FENTANYL CITRATE 0.05 MG/ML IJ SOLN
INTRAMUSCULAR | Status: AC | PRN
  Administered 2014-05-30 (×4): 25 ug via INTRAVENOUS

## 2014-05-30 MED ORDER — FENTANYL CITRATE 0.05 MG/ML IJ SOLN
INTRAMUSCULAR | Status: AC
  Filled 2014-05-30: qty 4

## 2014-05-30 NOTE — H&P (Signed)
Chief Complaint: "I'm having a liver biopsy" Referring Physician(s): Magod,Marc E  History of Present Illness: [name] is a 79 y.o. male with prior history of prostate cancer/melanoma, anemia, diminished appetite, fatigue, dyspnea with exertion and imaging revealing left hepatic lobe lesions. He presents today for US guided liver lesion biopsy.   Past Medical History  Diagnosis Date  . Hypertension   . DDD (degenerative disc disease), lumbar   . 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 Seizure     none  x 35 yrs  . Macular degeneration     left/ dry    Past Surgical History  Procedure Laterality Date  . Colostomy    . Joint replacement Right 2009    knee  . Tonsillectomy    . Insertion prostate radiation seed  2002  . Mohs surgery Left 9/14    with radiation  . Eye surgery Bilateral     bilateral cataract extraction with IOL  . Colon surgery    . Hydrocelectomy    . Total knee arthroplasty Left 05/08/2013    Procedure: LEFT TOTAL KNEE ARTHROPLASTY;  Surgeon: Gearlean Alf, MD;  Location: [facility];  Service: Orthopedics;  Laterality: Left;    Allergies: Review of patient's allergies indicates no known allergies.  Medications: Prior to Admission medications   Medication Sig Start Date End Date Taking? Authorizing Provider  amLODipine (NORVASC) 5 MG tablet Take 5 mg by mouth daily with breakfast.   Yes Historical Provider, MD  iron polysaccharides (NIFEREX) 150 MG capsule Take 1 capsule (150 mg total) by mouth 2 (two) times daily. Patient taking differently: Take 150 mg by mouth every morning.  05/11/13  Yes Arlee Muslim, PA-C  metoprolol tartrate (LOPRESSOR) 25 MG tablet Take 25 mg by mouth 2 (two) times daily.    Yes Historical Provider, MD  Multiple Vitamins-Minerals (PRESERVISION AREDS 2 PO) Take 1 capsule by mouth 2 (two) times daily.   Yes Historical Provider, MD  PHENobarbital (LUMINAL) 32.4 MG tablet Take two tablets by  mouth every morning; Take one tablet by mouth every evening 05/11/13  Yes Tiffany L Reed, DO  phenytoin (DILANTIN) 100 MG ER capsule Take 200 mg by mouth 2 (two) times daily.    Yes Historical Provider, MD  acetaminophen (TYLENOL) 325 MG tablet Take 2 tablets (650 mg total) by mouth every 6 (six) hours as needed for mild pain (or Fever >/= 101). 05/11/13   Arlee Muslim, PA-C  bisacodyl (DULCOLAX) 10 MG suppository Place 1 suppository (10 mg total) rectally daily as needed for moderate constipation. Patient not taking: Reported on 05/29/2014 05/11/13   Arlee Muslim, PA-C  docusate sodium 100 MG CAPS Take 100 mg by mouth 2 (two) times daily. Patient not taking: Reported on 05/29/2014 05/11/13   Arlee Muslim, PA-C  enoxaparin (LOVENOX) 30 MG/0.3ML injection Inject 0.3 mLs (30 mg total) into the skin every 12 (twelve) hours. Continue Lovenox for one more week and then take a full dose Aspirin 325 mg daily for three more weeks, then discontinue. Patient not taking: Reported on 05/29/2014 05/11/13   Arlee Muslim, PA-C  methocarbamol (ROBAXIN) 500 MG tablet Take 1 tablet (500 mg total) by mouth every 6 (six) hours as needed for muscle spasms. Patient not taking: Reported on 05/29/2014 05/11/13   Arlee Muslim, PA-C  metoCLOPramide (REGLAN) 5 MG tablet Take 1-2 tablets (5-10 mg total) by mouth every 8 (eight) hours as needed for nausea (  if ondansetron (ZOFRAN) ineffective.). Patient not taking: Reported on 05/29/2014 05/11/13   Arlee Muslim, PA-C  ondansetron (ZOFRAN) 4 MG tablet Take 1 tablet (4 mg total) by mouth every 6 (six) hours as needed for nausea. Patient not taking: Reported on 05/29/2014 05/11/13   Arlee Muslim, PA-C  oxyCODONE (OXY IR/ROXICODONE) 5 MG immediate release tablet Take one to two tablet by mouth every 3 hours as needed for breakthrough pain Patient not taking: Reported on 05/29/2014 05/11/13   Tiffany L Reed, DO  polyethylene glycol (MIRALAX / GLYCOLAX) packet Take 17 g by mouth daily  as needed for mild constipation. Patient not taking: Reported on 05/29/2014 05/11/13   Arlee Muslim, PA-C  traMADol Veatrice Bourbon) 50 MG tablet Take one to two tablets by mouth every 6 hours as needed for mild pain Patient not taking: Reported on 05/29/2014 05/11/13   Gayland Curry, DO    No family history on file.  History   Social History  . Marital Status: Married    Spouse Name: N/A    Number of Children: N/A  . Years of Education: N/A   Social History Main Topics  . Smoking status: Former Smoker -- 0.50 packs/day    Types: Cigarettes  . Smokeless tobacco: Never Used  . Alcohol Use: Yes     Comment: socially  . Drug Use: No  . Sexual Activity: Not on file   Other Topics Concern  . Not on file   Social History Narrative  . No narrative on file      Review of Systems  Constitutional: Positive for appetite change and fatigue. Negative for fever.  Respiratory: Positive for shortness of breath. Negative for cough.   Cardiovascular: Negative for chest pain.  Gastrointestinal: Negative for nausea, vomiting, abdominal pain and blood in stool.  Genitourinary: Positive for frequency. Negative for dysuria and hematuria.  Musculoskeletal: Negative for back pain.  Neurological: Negative for headaches.    Vital Signs: BP 161/72 mmHg  Pulse 75  Temp(Src) 98 F (36.7 C) (Oral)  Resp 20  Ht 5\' 11"  (1.803 m)  Wt 175 lb (79.379 kg)  BMI 24.42 kg/m2  SpO2 96%  Physical Exam  Constitutional: He is oriented to person, place, and time. He appears well-developed and well-nourished.  Cardiovascular: Normal rate and regular rhythm.   Pulmonary/Chest: Effort normal and breath sounds normal.  Abdominal: Soft. Bowel sounds are normal.  Clean, intact LLQ colostomy  Musculoskeletal: Normal range of motion. He exhibits no edema.  Neurological: He is alert and oriented to person, place, and time.    Imaging: Mr Abdomen Moise Boring Contrast  05/28/2014   ADDENDUM REPORT: 05/28/2014 13:22   ADDENDUM: Further clinical history is available. Despite the clinical history submitted by the technologist, the patient does not have a history of colon carcinoma. The only primary malignancy is prostate carcinoma. Therefore, the potential etiology of the left hepatic lobe lesions with intrahepatic ductal dilatation include cholangiocarcinoma with intrahepatic metastasis or multi focal metastasis from an unknown primary. Although this is not the typical appearance of prostate carcinoma metastasis, this cannot be excluded. Tissue sampling, likely via ultrasound guidance should be considered.   Electronically Signed   By: Abigail Miyamoto M.D.   On: 05/28/2014 13:22   05/28/2014   CLINICAL DATA:  Abnormal left lobe of the liver on CT. History of colon cancer with colostomy. Melanoma and basal cell skin cancer. ICD10: R 93.8.  EXAM: MRI ABDOMEN WITHOUT AND WITH CONTRAST  TECHNIQUE: Multiplanar multisequence MR imaging of  the abdomen was performed both before and after the administration of intravenous contrast.  CONTRAST:  40mL MULTIHANCE GADOBENATE DIMEGLUMINE 529 MG/ML IV SOLN  COMPARISON:  CT 05/16/2014.  FINDINGS: Lower chest: Normal heart size without pericardial or pleural effusion.  Hepatobiliary: Hepatomegaly, greater than 20 cm craniocaudal. No convincing evidence of cirrhosis.  Multiple hypoenhancing lesions within the left lobe of the liver, highly suspicious for metastatic disease. Best evaluated on series 16. Example at 1.3 cm in the posterior aspect of the medial segment left lobe. Example image 52. More anteriorly in the medial segment left lobe a 1.0 cm on image 32. Concurrent mild intrahepatic ductal dilatation, primarily in the lateral segment left liver lobe. Focal ductal dilatation more centrally in the left lobe of the liver, including on image 37 of series 16. This is presumably secondary to obstruction by 1 or more liver lesions. 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  Normal gallbladder, without biliary ductal dilatation.  Pancreas: Simple appearing cystic lesion within the pancreatic body. 1.0 cm on image 14 of series 5. No post-contrast enhancement. No main pancreatic duct dilatation or evidence of acute pancreatitis.  Spleen: Normal  Adrenals/Urinary Tract: Normal adrenal glands. Left renal sinus cysts. Tiny interpolar right renal cyst.  Stomach/Bowel: Underdistended proximal stomach. Normal small bowel caliber. Incompletely imaged left-sided ostomy. Right paracentral fat containing ventral hernia.  Vascular/Lymphatic: Aortic atherosclerosis. 1.4 cm a mildly enlarged porta hepatis node on image 59 of series 16.  Other:  No ascites.  Musculoskeletal: Mild convex left lumbar spine curvature.  IMPRESSION: 1. Multiple left hepatic lobe lesions, highly suspicious for metastatic disease. Morphology favors colonic Primary. 2. Mild intrahepatic ductal dilatation, likely secondary to obstruction by 1 of the metastatic lesions. The common duct is normal in caliber. 3. 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 4. Suspicious mild porta hepatis adenopathy. 5. Simple appearing cystic lesion within the pancreatic body. Most likely a pseudocyst. Indolent cystic neoplasm could look similar. Per consensus criteria, this should be re-evaluated with followup pancreatic protocol CT or MRI in 1 year. However, given patient age and comorbidities, this may not be necessary. This recommendation follows ACR consensus guidelines: Managing Incidental Findings on Abdominal CT: White Paper of the ACR Incidental Findings Committee. J Am Coll Radiol [phone]  Electronically Signed: By: Abigail Miyamoto M.D. On: 05/28/2014 08:33   Ct Abdomen Pelvis W Contrast  05/16/2014   CLINICAL DATA:  Generalized abdominal pain, history of prostate carcinoma and melanoma  EXAM: CT ABDOMEN AND PELVIS WITH CONTRAST  TECHNIQUE: Multidetector CT imaging of the abdomen and pelvis was performed using the  standard protocol following bolus administration of intravenous contrast.  CONTRAST:  132mL OMNIPAQUE IOHEXOL 300 MG/ML  SOLN  COMPARISON:  By report from 12/07/2000  FINDINGS: The lung bases are free of acute infiltrate or sizable effusion. Minimal scarring is noted in the left lung base.  The right lobe of the liver is within normal limits. Differential enhancement is noted within the left lobe of the liver. Some central hypodensities are identified the largest of which measures 2.5 cm. A would be difficult to exclude these as simple cysts. The lateral branch of the left portal vein shows findings suggestive of intraluminal filling defect which may represent tumor thrombus. Scattered smaller hypodensities with some enhancement are seen within the left lobe of the liver suggestive of metastatic disease. Some very minimal ductal dilatation is noted as well. The spleen, gallbladder, adrenal glands and kidneys are all  within normal limits. No renal calculi or obstructive changes are seen.  Pancreas demonstrates a hypodense lesion within its midportion. This measures approximately 14 mm. A cystic neoplasm could not be totally excluded.  Aortoiliac calcifications are noted without definitive aneurysmal dilatation. There are changes consistent with prior colonic surgery with a colostomy in the left mid abdomen. No incarcerated bowel loops are identified. A small hernias noted just to the right of the midline at the level of the colostomy. No incarcerated bowel loops are noted.  Scanning into the pelvis shows the prostate brachytherapy seeds. No definitive pelvic mass lesion or sidewall abnormality is noted. The appendix is within normal limits. The bony structures show degenerative change of the lumbar spine.  IMPRESSION: Areas of decreased attenuation within the left lobe of the liver suspicious for metastatic disease. The largest of these measures just over 1 cm. Some adjacent cystic appearing lesions are noted as well  which may be of a more chronic nature. There also changes suggestive of tumor thrombus within the left portal vein. MRI of the abdomen with and without contrast is recommended for further characterization.  Cystic lesion within the midportion of the pancreatic body. This is of uncertain significance.  These results will be called to the ordering clinician or representative by the Radiologist Assistant, and communication documented in the PACS or zVision Dashboard.   Electronically Signed   By: Inez Catalina M.D.   On: 05/16/2014 11:48    Labs:  CBC:  Recent Labs  05/30/14 1115  WBC 12.4*  HGB 9.2*  HCT 27.4*  PLT 358    COAGS:  Recent Labs  05/30/14 1115  INR 1.21  APTT 37    BMP: No results for input(s): NA, K, CL, CO2, GLUCOSE, BUN, CALCIUM, CREATININE, GFRNONAA, GFRAA in the last 8760 hours.  Invalid input(s): CMP  LIVER FUNCTION TESTS: No results for input(s): BILITOT, AST, ALT, ALKPHOS, PROT, ALBUMIN in the last 8760 hours.  TUMOR MARKERS: No results for input(s): AFPTM, CEA, CA199, CHROMGRNA in the last 8760 hours.  Assessment and Plan: MANOAH DECKARD is a 80 y.o. male with prior history of prostate cancer/melanoma, anemia, diminished appetite, fatigue, dyspnea with exertion and imaging revealing left hepatic lobe lesions. He presents today for US guided liver lesion biopsy. Details/risks of procedure d/w pt/wife with their understanding and consent. Hgb stable today but mild leukocytosis noted.       Signed: Autumn Messing 05/30/2014, 1:02 PM

## 2014-05-30 NOTE — Discharge Instructions (Signed)
Liver Biopsy, Care After °Refer to this sheet in the next few weeks. These instructions provide you with information on caring for yourself after your procedure. Your health care provider may also give you more specific instructions. Your treatment has been planned according to current medical practices, but problems sometimes occur. Call your health care provider if you have any problems or questions after your procedure. °WHAT TO EXPECT AFTER THE PROCEDURE °After your procedure, it is typical to have the following: °· A small amount of discomfort in the area where the biopsy was done and in the right shoulder or shoulder blade. °· A small amount of bruising around the area where the biopsy was done and on the skin over the liver. °· Sleepiness and fatigue for the rest of the day. °HOME CARE INSTRUCTIONS  °· Rest at home for 1-2 days or as directed by your health care provider. °· Have a friend or family member stay with you for at least 24 hours. °· Because of the medicines used during the procedure, you should not do the following things in the first 24 hours: °¨ Drive. °¨ Use machinery. °¨ Be responsible for the care of other people. °¨ Sign legal documents. °¨ Take a bath or shower. °· There are many different ways to close and cover an incision, including stitches, skin glue, and adhesive strips. Follow your health care provider's instructions on: °¨ Incision care. °¨ Bandage (dressing) changes and removal. °¨ Incision closure removal. °· Do not drink alcohol in the first week. °· Do not lift more than 5 pounds or play contact sports for 2 weeks after this test. °· Take medicines only as directed by your health care provider. Do not take medicine containing aspirin or non-steroidal anti-inflammatory medicines such as ibuprofen for 1 week after this test. °· It is your responsibility to get your test results. °SEEK MEDICAL CARE IF:  °· You have increased bleeding from an incision that results in more than a  small spot of blood. °· You have redness, swelling, or increasing pain in any incisions. °· You notice a discharge or a bad smell coming from any of your incisions. °· You have a fever or chills. °SEEK IMMEDIATE MEDICAL CARE IF:  °· You develop swelling, bloating, or pain in your abdomen. °· You become dizzy or faint. °· You develop a rash. °· You are nauseous or vomit. °· You have difficulty breathing, feel short of breath, or feel faint. °· You develop chest pain. °· You have problems with your speech or vision. °· You have trouble balancing or moving your arms or legs. °Document Released: 12/05/2004 Document Revised: 10/02/2013 Document Reviewed: 07/14/2013 °[employer]® Patient Information ©2015 [employer]. This information is not intended to replace advice given to you by your health care provider. Make sure you discuss any questions you have with your health care provider. °Conscious Sedation, Adult, Care After °Refer to this sheet in the next few weeks. These instructions provide you with information on caring for yourself after your procedure. Your health care provider may also give you more specific instructions. Your treatment has been planned according to current medical practices, but problems sometimes occur. Call your health care provider if you have any problems or questions after your procedure. °WHAT TO EXPECT AFTER THE PROCEDURE  °After your procedure: °· You may feel sleepy, clumsy, and have poor balance for several hours. °· Vomiting may occur if you eat too soon after the procedure. °HOME CARE INSTRUCTIONS °· Do not participate   in any activities where you could become injured for at least 24 hours. Do not: °¨ Drive. °¨ Swim. °¨ Ride a bicycle. °¨ Operate heavy machinery. °¨ Cook. °¨ Use power tools. °¨ Climb ladders. °¨ Work from a high place. °· Do not make important decisions or sign legal documents until you are improved. °· If you vomit, drink water, juice, or soup when you can drink without  vomiting. Make sure you have little or no nausea before eating solid foods. °· Only take over-the-counter or prescription medicines for pain, discomfort, or fever as directed by your health care provider. °· Make sure you and your family fully understand everything about the medicines given to you, including what side effects may occur. °· You should not drink alcohol, take sleeping pills, or take medicines that cause drowsiness for at least 24 hours. °· If you smoke, do not smoke without supervision. °· If you are feeling better, you may resume normal activities 24 hours after you were sedated. °· Keep all appointments with your health care provider. °SEEK MEDICAL CARE IF: °· Your skin is pale or bluish in color. °· You continue to feel nauseous or vomit. °· Your pain is getting worse and is not helped by medicine. °· You have bleeding or swelling. °· You are still sleepy or feeling clumsy after 24 hours. °SEEK IMMEDIATE MEDICAL CARE IF: °· You develop a rash. °· You have difficulty breathing. °· You develop any type of allergic problem. °· You have a fever. °MAKE SURE YOU: °· Understand these instructions. °· Will watch your condition. °· Will get help right away if you are not doing well or get worse. °Document Released: 03/08/2013 Document Reviewed: 03/08/2013 °[employer]® Patient Information ©2015 [employer]. This information is not intended to replace advice given to you by your health care provider. Make sure you discuss any questions you have with your health care provider. ° °

## 2014-05-30 NOTE — Procedures (Signed)
Interventional Radiology Procedure Note  Procedure: US guided liver biopsy.  2 x 18G core.  Complications: No immediate Recommendations:  - Ok to shower tomorrow - Do not submerge for 7 days - follow up result  Signed,  Dulcy Fanny. Earleen Newport, DO

## 2014-06-06 ENCOUNTER — Other Ambulatory Visit (HOSPITAL_COMMUNITY): Payer: Self-pay | Admitting: Gastroenterology

## 2014-06-06 DIAGNOSIS — C787 Secondary malignant neoplasm of liver and intrahepatic bile duct: Secondary | ICD-10-CM

## 2014-06-08 ENCOUNTER — Telehealth: Payer: Self-pay | Admitting: Oncology

## 2014-06-08 NOTE — Telephone Encounter (Signed)
S/W PATIENT AND GAVE NP APPT FOR 01/15 @ 9:30 W/DR. SHERRILL REFERRING DR. MARC MAGOD DX- LIVER CA

## 2014-06-14 ENCOUNTER — Encounter (HOSPITAL_COMMUNITY)
Admission: RE | Admit: 2014-06-14 | Discharge: 2014-06-14 | Disposition: A | Discharge status: Home / Self Care | Payer: Commercial Managed Care - HMO | Source: Ambulatory Visit | Attending: Gastroenterology | Admitting: Gastroenterology

## 2014-06-14 DIAGNOSIS — C787 Secondary malignant neoplasm of liver and intrahepatic bile duct: Secondary | ICD-10-CM | POA: Diagnosis not present

## 2014-06-14 DIAGNOSIS — C801 Malignant (primary) neoplasm, unspecified: Secondary | ICD-10-CM | POA: Insufficient documentation

## 2014-06-14 LAB — GLUCOSE, CAPILLARY: Glucose-Capillary: 96 mg/dL (ref 70–99)

## 2014-06-14 MED ORDER — FLUDEOXYGLUCOSE F - 18 (FDG) INJECTION
8.6000 | Freq: Once | INTRAVENOUS | Status: AC | PRN
  Administered 2014-06-14: 8.6 via INTRAVENOUS

## 2014-06-15 ENCOUNTER — Encounter: Payer: Self-pay | Admitting: Oncology

## 2014-06-15 ENCOUNTER — Other Ambulatory Visit: Payer: Self-pay | Admitting: *Deleted

## 2014-06-15 ENCOUNTER — Ambulatory Visit (HOSPITAL_BASED_OUTPATIENT_CLINIC_OR_DEPARTMENT_OTHER): Payer: Commercial Managed Care - HMO | Admitting: Oncology

## 2014-06-15 ENCOUNTER — Telehealth: Payer: Self-pay | Admitting: Oncology

## 2014-06-15 ENCOUNTER — Ambulatory Visit: Payer: Commercial Managed Care - HMO

## 2014-06-15 VITALS — BP 94/52 | HR 62 | Temp 97.5°F | Resp 16 | Ht 71.0 in | Wt 172.9 lb

## 2014-06-15 DIAGNOSIS — C801 Malignant (primary) neoplasm, unspecified: Secondary | ICD-10-CM

## 2014-06-15 DIAGNOSIS — I81 Portal vein thrombosis: Secondary | ICD-10-CM

## 2014-06-15 DIAGNOSIS — D649 Anemia, unspecified: Secondary | ICD-10-CM

## 2014-06-15 DIAGNOSIS — Z8546 Personal history of malignant neoplasm of prostate: Secondary | ICD-10-CM

## 2014-06-15 DIAGNOSIS — N289 Disorder of kidney and ureter, unspecified: Secondary | ICD-10-CM

## 2014-06-15 DIAGNOSIS — Z85828 Personal history of other malignant neoplasm of skin: Secondary | ICD-10-CM

## 2014-06-15 DIAGNOSIS — D630 Anemia in neoplastic disease: Secondary | ICD-10-CM

## 2014-06-15 DIAGNOSIS — C7951 Secondary malignant neoplasm of bone: Secondary | ICD-10-CM

## 2014-06-15 DIAGNOSIS — C772 Secondary and unspecified malignant neoplasm of intra-abdominal lymph nodes: Secondary | ICD-10-CM

## 2014-06-15 DIAGNOSIS — D638 Anemia in other chronic diseases classified elsewhere: Secondary | ICD-10-CM

## 2014-06-15 DIAGNOSIS — C787 Secondary malignant neoplasm of liver and intrahepatic bile duct: Secondary | ICD-10-CM

## 2014-06-15 DIAGNOSIS — R630 Anorexia: Secondary | ICD-10-CM

## 2014-06-15 DIAGNOSIS — R634 Abnormal weight loss: Secondary | ICD-10-CM

## 2014-06-15 MED ORDER — MEGESTROL ACETATE 40 MG/ML PO SUSP
200.0000 mg | Freq: Two times a day (BID) | ORAL | Status: DC
Start: 2014-06-15 — End: 2014-07-16

## 2014-06-15 NOTE — Progress Notes (Signed)
Checked in new pt with no financial concerns prior to seeing the dr.  I informed pt that if chemo is part of his treatment I will contact his ins to see if auth is req and will obtain that if it is as well as contact foundations that offer copay assistance for chemo if needed.  He has my card for any billing or insurance questions or concerns °

## 2014-06-15 NOTE — Progress Notes (Signed)
[name]   Referring QT:MAUQ Magod  [name] 81 y.o.  12/02/1927    Reason for Referral: Metastatic carcinoma   HPI: He saw his primary physician for evaluation of anorexia and early satiety. He was noted to be anemic and was referred to Dr.Magod. He had a colonoscopy in 2012 that showed diverticulosis. The stool was Hemoccult negative.  A CT of the abdomen and pelvis on 05/16/2014 revealed areas of decreased attenuation in the left lobe of the liver suspicious for metastatic disease. Changes suggestive of tumor thrombus or seen within the left portal vein. A cystic lesion was noted in the midportion of the pancreatic body.  An MRI of the abdomen on 05/28/2014 revealed multiple hypoenhancing lesions in the left lobe of the liver suspicious for metastatic disease. Intrahepatic ductal dilatation centrally in the left lobe of the liver. 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 A simple. Cystic lesion was noted in the pancreatic body without enhancement. No main pancreatic duct dilatation.   He was referred for an ultrasound-guided biopsy of a left liver lesion on 05/30/2014 [phone]). This confirmed a poorly temperature to carcinoma. The tumor is positive for cytokeratin 7, cytokeratin 20, p53, p63, and cytokeratin 5/6. The tumor cells were negative for TTF-1, PSA, prostein, CDX-2,, AFP, CEA, and hepar-1. The differential diagnosis included a pancreatic 2 biliary and urothelial primary.  A PET scan 06/14/2014 revealed multifocal hypermetabolic lesions in the left lobe of the liver, hypermetabolic upper abdominal lymph nodes, and multifocal hypermetabolic bone metastases involving the spine and pelvis.  He continues to have anorexia. He is referred for oncology evaluation.  Past Medical History  Diagnosis Date  . Hypertension   . DDD (degenerative disc disease), lumbar   . Cancer-prostate treated with seed implant therapy        .  Seizure disorder      none  x 35 yrs  . Macular degeneration     left/ dry    .   Melanoma of the right cheek   .  Basal cell carcinoma of the ear   .  Rectal ulcer secondary to radiation seed implant-2002  Past Surgical History  Procedure Laterality Date  . Colostomy    . Joint replacement Right 2009    knee  . Tonsillectomy    . Insertion prostate radiation seed  2002  . Mohs surgery Left 9/14    with radiation  . Eye surgery Bilateral     bilateral cataract extraction with IOL  .     Marland Kitchen Hydrocelectomy    . Total knee arthroplasty Left 05/08/2013    Procedure: LEFT TOTAL KNEE ARTHROPLASTY;  Surgeon: Gearlean Alf, MD;  Location: [facility];  Service: Orthopedics;  Laterality: Left;    Medications: Reviewed  Allergies: No Known Allergies  Family history: His mother had breast cancer. He had 4 brothers and one sister. One brother had leukemia. No other family history of cancer.  Social History:   He lives in [place]. He is retired from office occupation. He quit smoking cigarettes 40 years ago. Social alcohol use. He was transfused with knee replacement surgery in 2014. No risk factor for HIV or hepatitis.   ROS:   Positives include: Anorexia, 20 pound weight loss, solid dysphagia, nocturia-not improved with Flomax  A complete ROS was otherwise negative.  Physical Exam:  Blood pressure 94/52, pulse 62, temperature 97.5 F (36.4 C), temperature source Oral, resp. rate 16, height _0  (1.803 m),  weight 172 lb 14.4 oz (78.427 kg), SpO2 88 %.  HEENT: Upper denture plate, partial lower denture plate, oropharynx without visible mass, neck without mass Lungs: Clear bilaterally Cardiac: Regular rate and rhythm Abdomen: No hepatomegaly, nontender, no mass GU: Soft enlargement throughout the left scrotum, no discrete mass (series he reports a history of a hydrocele)   Vascular: No leg edema Lymph nodes: No cervical, supra-clavicular, axillary, or inguinal  nodes Neurologic: Alert and oriented, the motor exam appears intact in the upper and lower extremities Skin: Multiple scars over the trunk, multiple benign appearing moles over the trunk and extremities, raised 1 cm nodular lesion at the left pretibial area Musculoskeletal: No spine tenderness   LAB:  CBC  Lab Results  Component Value Date   WBC 12.4* 05/30/2014   HGB 9.2* 05/30/2014   HCT 27.4* 05/30/2014   MCV 97.2 05/30/2014   PLT 358 05/30/2014     CMP      Component Value Date/Time   NA 132* 05/11/2013 0518   K 3.8 05/11/2013 0518   CL 98 05/11/2013 0518   CO2 21 05/11/2013 0518   GLUCOSE 101* 05/11/2013 0518   BUN 14 05/11/2013 0518   CREATININE 1.07 05/11/2013 0518   CALCIUM 8.3* 05/11/2013 0518   PROT 7.1 05/03/2013 1120   ALBUMIN 3.9 05/03/2013 1120   AST 22 05/03/2013 1120   ALT 11 05/03/2013 1120   ALKPHOS 96 05/03/2013 1120   BILITOT 0.3 05/03/2013 1120   GFRNONAA 62* 05/11/2013 0518   GFRAA 72* 05/11/2013 0518    06/06/2014-AFP 1.7, CA 19-9 21, CEA 1.5, PSA 0.06  Creatinine 1.52, sodium 1:30, calcium 10, bilirubin 0.3 Hemolymph 9.5, white count 11.8, platelet is 346 found, ANC 9.4   Imaging:  Nm Pet Image Initial (pi) Skull Base To Thigh  06/14/2014   CLINICAL DATA:  Initial treatment strategy for poorly differentiated carcinoma involving the liver.  EXAM: NUCLEAR MEDICINE PET SKULL BASE TO THIGH  TECHNIQUE: 8.6 mCi F-18 FDG was injected intravenously. Full-ring PET imaging was performed from the skull base to thigh after the radiotracer. CT data was obtained and used for attenuation correction and anatomic localization.  FASTING BLOOD GLUCOSE:  Value: 9.6 mg/dl  COMPARISON:  05/26/2014  FINDINGS: NECK  No hypermetabolic lymph nodes in the neck.  CHEST  No hypermetabolic mediastinal or hilar nodes. Calcified atherosclerotic disease involves the thoracic aorta as well as the LAD, left circumflex and RCA coronary arteries. No suspicious pulmonary nodules on  the CT scan.  ABDOMEN/PELVIS  Dominant lesion involving the lateral segment of left lobe of liver measures approximately 8 x 5.7 cm and has an SUV max equal to 13.3. Lesion involving the medial segment of left hepatic lobe measures 3.7 cm and has an SUV max equal to 17.3. Several lesions are identified within the caudate lobe of liver within SUV max of 11.7. Gastrohepatic ligament lymph node measures 1.4 cm and has an SUV max equal to 10.0. Hypermetabolic periaortic lymph node has an SUV max equal to 5.1. Hypermetabolic upper abdominal lymph nodes are identified.  Patient is noted have a left lower quadrant colostomy. There are several ventral abdominal wall hernias which contain fat.  SKELETON  Index lesion in the right iliac bone measures approximately 1.8 cm and has an SUV max equal to 3.9. Index lesion involving the T8 vertebra has an SUV max equal to 4.3.  IMPRESSION: 1. Multi focal hypermetabolic lesions involve both segments of the left lobe of liver as well as the  caudate lobe. 2. Enlarged and hypermetabolic upper abdominal lymph node metastasis 3. Multifocal hypermetabolic bone metastasis involving the spine and pelvis.   Electronically Signed   By: Kerby Moors M.D.   On: 06/14/2014 16:18   I reviewed the PET and CT images with [name] and his family   Assessment/Plan:   1. Metastatic poorly different data carcinoma-unknown primary  Staging CTs, MRI abdomen, and PET scan confirmed extensive left liver metastases, upper abdominal lymphadenopathy, and bone metastases 2. Anorexia/weight loss secondary to #1  3.   Nonocclusive left portal vein thrombosis  4.  Anemia-likely secondary to renal insufficiency, bone metastases, and chronic disease  5.   Renal insufficiency  6.   History of prostate cancer treated with radiation seed implantation  7.   Rectal ulcer secondary to radiation seed implants, status post a colostomy in 2002  8.   History of skin cancers including melanoma and  basal cell carcinoma   Disposition:   [name] has been diagnosed with metastatic poorly different data carcinoma. I reviewed the imaging studies and discussed the differential diagnosis with [name] and his family. There is no clear primary tumor site based on review of the history, imaging studies, physical exam, tumor markers, and pathology. It is unlikely the metastatic disease is related to prostate cancer or skin cancer. I discussed the case with Dr. Watt Climes. He will proceed with a diagnostic upper endoscopy since [name] complains of early satiety and dysphagia.  I will present his case at the GI tumor conference next week. He will return for an office visit 06/21/2013. We discussed supportive care versus a trial of systemic chemotherapy.  He will begin Megace as an appetite stimulant. He will discontinue amlodipine, metoprolol, and Flomax. He has lost weight in the blood pressure is low today. He reports the Flomax has not helped.  Approximate 60 minutes were spent with patient today. The majority of the time was used for counseling and coordination of care.  [place], [place] 06/15/2014, 10:31 AM

## 2014-06-15 NOTE — Telephone Encounter (Signed)
gv adn printed appt sched and avs for pt for Jan °

## 2014-06-15 NOTE — Telephone Encounter (Signed)
Lft msg for pt confirming MD visit changed per 01/15 POF, pt;s son called to confirm... KJ

## 2014-06-18 ENCOUNTER — Encounter: Payer: Self-pay | Admitting: Oncology

## 2014-06-18 ENCOUNTER — Telehealth: Payer: Self-pay | Admitting: *Deleted

## 2014-06-18 NOTE — Telephone Encounter (Signed)
RECEIVED A FAX FROM [facility] CONCERNING A PRIOR AUTHORIZATION FOR MEGESTROL. THIS REQUEST WAS PLACED IN THE MANAGED CARE BIN.

## 2014-06-18 NOTE — Progress Notes (Signed)
[employer] approved megestrol acetate 40mg /ml from 06/18/14-06/19/15

## 2014-06-18 NOTE — Telephone Encounter (Signed)
Wife left VM X 2 today requesting nurse to call insurance company to get the Megace authorized. Says "he needs this medicine now". Called wife back and made her aware that the PA request from pharmacy was faxed on Saturday and office was closed. It has been forwarded to managed care department Charlena Cross) today to work on the [place]. She will attempt to do it online since insurance company is closed today due to [employer].

## 2014-06-20 ENCOUNTER — Ambulatory Visit: Payer: Commercial Managed Care - HMO | Admitting: Oncology

## 2014-06-21 ENCOUNTER — Ambulatory Visit (HOSPITAL_BASED_OUTPATIENT_CLINIC_OR_DEPARTMENT_OTHER): Payer: Commercial Managed Care - HMO | Admitting: Oncology

## 2014-06-21 ENCOUNTER — Other Ambulatory Visit: Payer: Self-pay | Admitting: *Deleted

## 2014-06-21 ENCOUNTER — Telehealth: Payer: Self-pay | Admitting: Oncology

## 2014-06-21 VITALS — BP 127/70 | HR 69 | Temp 97.0°F | Resp 18 | Ht 71.0 in | Wt 170.9 lb

## 2014-06-21 DIAGNOSIS — C801 Malignant (primary) neoplasm, unspecified: Secondary | ICD-10-CM

## 2014-06-21 DIAGNOSIS — R531 Weakness: Secondary | ICD-10-CM

## 2014-06-21 DIAGNOSIS — C7951 Secondary malignant neoplasm of bone: Secondary | ICD-10-CM

## 2014-06-21 DIAGNOSIS — N289 Disorder of kidney and ureter, unspecified: Secondary | ICD-10-CM

## 2014-06-21 DIAGNOSIS — D649 Anemia, unspecified: Secondary | ICD-10-CM

## 2014-06-21 MED ORDER — PREDNISONE 10 MG PO TABS: 10.0000 mg | ORAL_TABLET | Freq: Every day | ORAL | Status: DC

## 2014-06-21 NOTE — Progress Notes (Signed)
Called Dr. Perley Jain office for procedure report from endo yesterday. Note is available, but not yet reviewed by MD. She will fax the unreviewed note to office.

## 2014-06-21 NOTE — Patient Instructions (Signed)
Please provide office with copy of your Advanced Directive when available.

## 2014-06-21 NOTE — Progress Notes (Signed)
  [facility] OFFICE PROGRESS NOTE   Diagnosis: Metastatic carcinoma  INTERVAL HISTORY:   [name] returns as scheduled. He continues to feel weak and has a poor appetite.He reports some improvement in his appetite since starting Megace. No pain. He is able to ambulate short distances in the home, but spends majority of the time in a chair.  He underwent an upper endoscopy by Dr. Watt Climes yesterday and there was no evidence of a tumor.   Objective:  Vital signs in last 24 hours:  Blood pressure 127/70, pulse 69, temperature 97 F (36.1 C), temperature source Oral, resp. rate 18, height 5\' 11"  (1.803 m), weight 170 lb 14.4 oz (77.52 kg), SpO2 100 %. Resp: Lungs clear bilaterally Cardiac: Regular rate and rhythm GI: No hepatomegaly, nontender, left lower quadrant colostomy  Vascular: No leg edema   Lab Results:  Lab Results  Component Value Date   WBC 12.4* 05/30/2014   HGB 9.2* 05/30/2014   HCT 27.4* 05/30/2014   MCV 97.2 05/30/2014   PLT 358 05/30/2014    Medications: I have reviewed the patient's current medications.  Assessment/Plan: 1. Metastatic poorly different data carcinoma-unknown primary  Staging CTs, MRI abdomen, and PET scan confirmed extensive left liver metastases, upper abdominal lymphadenopathy, and bone metastases 2. Anorexia/weight loss secondary to #1  3. Nonocclusive left portal vein thrombosis  4. Anemia-likely secondary to renal insufficiency, bone metastases, and chronic disease  5. Renal insufficiency  6. History of prostate cancer treated with radiation seed implantation  7. Rectal ulcer secondary to radiation seed implants, status post a colostomy in 2002  8. History of skin cancers including melanoma and basal cell carcinoma    Disposition: [name] has a metastatic poorly differentiated carcinoma involving the liver and bones. His case was presented at the GI tumor conference 06/20/2014. 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 He has a poor performance status ( ECOG 2). I explained therapy will be curative and there is no proven survival benefit with systemic chemotherapy in this setting. I recommend comfort care. He agrees.   He will continue Megace. He will begin low-dose prednisone an attempt to improve his energy level and appetite.  We will make a referral to the [facility] program. We discussed CPR and ACLS issues. He will discuss this further with his family prior to making a CODE STATUS decision.  [name] will return for an office visit in 3 weeks.          Betsy Coder, MD  06/21/2014  4:37 PM

## 2014-06-21 NOTE — Telephone Encounter (Signed)
gave pt appt schedule for feb

## 2014-06-22 ENCOUNTER — Encounter: Payer: Self-pay | Admitting: *Deleted

## 2014-06-22 NOTE — Progress Notes (Signed)
Faxed referral form, office note and demographic info to [facility] for admission to Hospice. Code status is not yet determined.

## 2014-06-26 ENCOUNTER — Telehealth: Payer: Self-pay | Admitting: *Deleted

## 2014-06-26 NOTE — Telephone Encounter (Signed)
Patient called requesting "letter of medical necessity for me to see Dr. Earnestine Mealing at [facility] for second opinion.  My PCP told me to ask Dr. Benay Spice.  Scheduled to see Dr. Earnestine Mealing 07-06-2014 and I'm having problems with insurance."  Will notify Dr. Benay Spice of this request.  Next [facility] F/U is 07-13-2014.

## 2014-06-26 NOTE — Telephone Encounter (Signed)
I called Tedra Coupe in Radiology and she will have CD prepared for patient to pick up tomorrow afternoon.  I also spoke to the patient to let him know as well.

## 2014-06-26 NOTE — Telephone Encounter (Signed)
HE WOULD LIKE A CD WITH ALL RADIOLOGY TESTS AND BIOPSY.

## 2014-06-26 NOTE — Telephone Encounter (Signed)
Received call from [place] with hospice, pt declined hospice. Dr. Benay Spice made aware.

## 2014-06-28 ENCOUNTER — Telehealth: Payer: Self-pay | Admitting: *Deleted

## 2014-06-28 DIAGNOSIS — C801 Malignant (primary) neoplasm, unspecified: Secondary | ICD-10-CM

## 2014-06-28 NOTE — Telephone Encounter (Signed)
Received message from Triage RN, pt requests return call. Spoke with pt he reports a referral will help get his visit covered by [facility]. Requests to pick up a copy of the referral after it is sent to [facility]. Referral will be left at front desk for pick up. (Late entry for 06/27/14) I spoke with pt and informed him we are unable to state it is medically necessary for him to see a specific physician. Dr. Benay Spice will send a referral if he wishes.

## 2014-07-09 ENCOUNTER — Other Ambulatory Visit: Payer: Self-pay | Admitting: *Deleted

## 2014-07-09 DIAGNOSIS — C801 Malignant (primary) neoplasm, unspecified: Secondary | ICD-10-CM

## 2014-07-09 MED ORDER — ALPRAZOLAM 0.5 MG PO TABS: ORAL_TABLET | ORAL | Status: AC

## 2014-07-10 ENCOUNTER — Telehealth: Payer: Self-pay | Admitting: *Deleted

## 2014-07-10 NOTE — Telephone Encounter (Signed)
Hospice nurse called to say patient has contacted them and is now ready to be seen by Hospice. Verbal order given. Originally done 06/22/14

## 2014-07-12 ENCOUNTER — Telehealth: Payer: Self-pay | Admitting: *Deleted

## 2014-07-12 NOTE — Telephone Encounter (Signed)
PT.'S ONLY COMPLAINT WAS WEAKNESS. A WALKER HAS BEEN ORDERED FOR PT. HE DOES HAVE A PRODUCTIVE COUGH WHICH CAN BE ADDRESSED AT TOMORROW'S APPOINTMENT.

## 2014-07-13 ENCOUNTER — Telehealth: Payer: Self-pay | Admitting: Oncology

## 2014-07-13 ENCOUNTER — Ambulatory Visit (HOSPITAL_BASED_OUTPATIENT_CLINIC_OR_DEPARTMENT_OTHER): Payer: Commercial Managed Care - HMO | Admitting: Oncology

## 2014-07-13 VITALS — BP 119/58 | HR 100 | Temp 97.9°F | Resp 20 | Ht 71.0 in | Wt 168.8 lb

## 2014-07-13 DIAGNOSIS — C801 Malignant (primary) neoplasm, unspecified: Secondary | ICD-10-CM

## 2014-07-13 DIAGNOSIS — C787 Secondary malignant neoplasm of liver and intrahepatic bile duct: Secondary | ICD-10-CM

## 2014-07-13 DIAGNOSIS — Z8546 Personal history of malignant neoplasm of prostate: Secondary | ICD-10-CM

## 2014-07-13 DIAGNOSIS — C7951 Secondary malignant neoplasm of bone: Secondary | ICD-10-CM

## 2014-07-13 DIAGNOSIS — C772 Secondary and unspecified malignant neoplasm of intra-abdominal lymph nodes: Secondary | ICD-10-CM

## 2014-07-13 DIAGNOSIS — Z85820 Personal history of malignant melanoma of skin: Secondary | ICD-10-CM

## 2014-07-13 NOTE — Progress Notes (Signed)
  [facility] OFFICE PROGRESS NOTE   Diagnosis: Metastatic carcinoma  INTERVAL HISTORY:   Mr. Husain returns as scheduled. He saw Dr.Abbruzzuse for a second opinion on 07/06/2014. Dr. Mammie Lorenzo agree with a probable diagnosis of cholangiocarcinoma and recommended comfort care. History Davisson has enrolled in the [facility] hospice program. His pain has improved with prednisone and Megace. He reports an intermittent productive cough. No fever. Pain. He continues to have malaise.  Objective:  Vital signs in last 24 hours:  Blood pressure 119/58, pulse 100, temperature 97.9 F (36.6 C), temperature source Oral, resp. rate 20, height 5\' 11"  (1.803 m), weight 168 lb 12.8 oz (76.567 kg).    HEENT: No thrush Resp: Lungs clear bilaterally Cardio: Regular rate and rhythm GI: No hepatomegaly, nontender, left lower quadrant colostomy Vascular: No leg edema   Medications: I have reviewed the patient's current medications.  Assessment/Plan: 1. Metastatic poorly different data carcinoma-unknown primary, likely cholangiocarcinoma  Staging CTs, MRI abdomen, and PET scan confirmed extensive left liver metastases, upper abdominal lymphadenopathy, and bone metastases 2. Anorexia/weight loss secondary to #1-now maintained on prednisone/Megace  3. Nonocclusive left portal vein thrombosis  4. Anemia-likely secondary to renal insufficiency, bone metastases, and chronic disease  5. Renal insufficiency  6. History of prostate cancer treated with radiation seed implantation  7. Rectal ulcer secondary to radiation seed implants, status post a colostomy in 2002  8. History of skin cancers including melanoma and basal cell carcinoma    Disposition:  Mr. Tomich has enrolled in the [facility] hospice program. He appears stable today. The plan is to continue comfort care. He will return for an office visit in approximately one month. We will see him in the interim as  needed.  Betsy Coder, MD  07/13/2014  10:45 AM

## 2014-07-13 NOTE — Telephone Encounter (Signed)
Gave avs & calendar for March. °

## 2014-07-16 ENCOUNTER — Telehealth: Payer: Self-pay

## 2014-07-16 DIAGNOSIS — R630 Anorexia: Secondary | ICD-10-CM

## 2014-07-16 MED ORDER — MEGESTROL ACETATE 40 MG/ML PO SUSP: 200.0000 mg | Freq: Two times a day (BID) | ORAL | Status: AC

## 2014-07-16 NOTE — Telephone Encounter (Signed)
Maura RN with hospice called to find out if any new orders or changes from OV last week. S/w Maura that the goal is comfort care and no new orders or meds. She mentioned a cough and this RN said to use hospice MDs for symptom management.

## 2014-07-16 NOTE — Telephone Encounter (Signed)
Cristela Blue Rn with hospice called again, requesting megace refill, a sleep aid and a question about billing. Per Dr Benay Spice let hospice MD help with sleep aid. We will refill megace. The bill will have a phone number on it to call to resolve issues. Cristela Blue also just found out pt is using extra strength tylenol to help him sleep/ calm down. Maura did teaching on tylenol and his liver. She also reminded him of his xanax rx. She will contact hospice MD for sleep aid.

## 2014-07-18 ENCOUNTER — Telehealth: Payer: Self-pay | Admitting: *Deleted

## 2014-07-18 NOTE — Telephone Encounter (Signed)
Rn from hospice called to update provider on recent fall last night. Patient had no injuries and vs were stable. Information forwarded to provider.

## 2014-07-24 ENCOUNTER — Telehealth: Payer: Self-pay | Admitting: *Deleted

## 2014-07-24 NOTE — Telephone Encounter (Signed)
WOULD DR.SHERRILL LIKE FOR THE HOSPICE PHYSICIAN TO HANDLE SYMPTOM MANAGEMENT? VERBAL ORDER AND READ BACK TO DR.[place]urn HANDLE SYMPTOM MANAGEMENT. NOTIFIED Cristela Blue

## 2014-07-27 ENCOUNTER — Other Ambulatory Visit: Payer: Self-pay | Admitting: *Deleted

## 2014-07-27 ENCOUNTER — Telehealth: Payer: Self-pay | Admitting: *Deleted

## 2014-07-27 MED ORDER — OXYCODONE HCL 5 MG PO TABS: 5.0000 mg | ORAL_TABLET | ORAL | Status: AC | PRN

## 2014-07-27 NOTE — Telephone Encounter (Signed)
[name], Hospice RN called from [name] home.  He needs a refill of oxycodone 5 mg 1-2 tabs po for pain, #60.  Hospice would like to add the indications of cough or labored breathing to the Rx for oxycodone.  Please fax to [facility] at [place] said that he seems to be declining rapidly.  Today she could not obtain an oxygen saturation reading on him, he is very pale, is not making eye contact.  He is still a full code and would like to be hospitalized when death is imminent.  Gave ok to add Senokot-S with narcotic regimen.

## 2014-07-30 ENCOUNTER — Other Ambulatory Visit: Payer: Self-pay | Admitting: *Deleted

## 2014-07-30 ENCOUNTER — Telehealth: Payer: Self-pay | Admitting: *Deleted

## 2014-07-30 DIAGNOSIS — C787 Secondary malignant neoplasm of liver and intrahepatic bile duct: Secondary | ICD-10-CM

## 2014-07-30 MED ORDER — RAPAFLO 8 MG PO CAPS: 8.0000 mg | ORAL_CAPSULE | Freq: Every day | ORAL | Status: AC

## 2014-07-30 NOTE — Telephone Encounter (Signed)
VERBAL ORDER AND READ BACK TO DR.Waldo MAY DO SYMPTOM MANAGEMENT.

## 2014-07-30 NOTE — Telephone Encounter (Signed)
WOULD DR.SHERRILL WANT TO TREAT PT.'S SYMPTOMS? PT. IS STILL HAVING URINARY HESITANCY AND NOCTURIA. HE IS TAKING RAPAFLOW 8MG  DAILY.

## 2014-08-07 ENCOUNTER — Telehealth: Payer: Self-pay | Admitting: *Deleted

## 2014-08-07 NOTE — Telephone Encounter (Signed)
Order called to Brandon Melnick, RN with hospice: Remeron 15 mg QHS. Per Dr. Benay Spice: Continue Megace if the family thinks it is helping. Cristela Blue will call in med once prior auth is obtained. She reports family is asking whether pt will be in pain as things progress. This is possible, will address these issues if they arise. Cristela Blue will review with the family.

## 2014-08-07 NOTE — Telephone Encounter (Signed)
Update from Brandon Melnick, Hospice RN. "Pt is mostly comfortable, definitely weaker and appears more pale. Appetite is declining some, he is staying in bed much more during the day. There's a little confusion, wife is asking if there is anything more we can do for his energy level. Pt continues steroid and megace, seems a bit withdrawn. Pt has been pain free. Cough is improved after taking Mucinex last week."  Update to Dr. Benay Spice.

## 2014-08-09 ENCOUNTER — Telehealth: Payer: Self-pay | Admitting: *Deleted

## 2014-08-09 NOTE — Telephone Encounter (Signed)
Brandon Melnick RN with Hospice called [phone].  Patient is getting weaker.  He comes to see Dr. Benay Spice tomorrow 08/10/14.  Family is concerned about urinary frequency overnight.  He is already on rapaflo and Cristela Blue feels there is not much else to do.  A condom catheter or foley could be considered.  She is wondering if Dr. Benay Spice wants a U/A C&S?Marland Kitchen  Cristela Blue states this is a chronic problem, not a new one.  She wanted to report it and advised the family to address this at visit with Dr. Benay Spice tomorrow.  She can be contacted at [phone] if Dr.Sherrill wants a U/A C&S today.   Will route to Dr.Sherrill and Rosalio Macadamia RN

## 2014-08-10 ENCOUNTER — Telehealth: Payer: Self-pay | Admitting: *Deleted

## 2014-08-10 ENCOUNTER — Ambulatory Visit (HOSPITAL_BASED_OUTPATIENT_CLINIC_OR_DEPARTMENT_OTHER): Admitting: Nurse Practitioner

## 2014-08-10 ENCOUNTER — Telehealth: Payer: Self-pay | Admitting: Nurse Practitioner

## 2014-08-10 VITALS — BP 104/48 | HR 85 | Temp 97.5°F | Resp 18 | Ht 71.0 in

## 2014-08-10 DIAGNOSIS — R351 Nocturia: Secondary | ICD-10-CM

## 2014-08-10 DIAGNOSIS — I81 Portal vein thrombosis: Secondary | ICD-10-CM

## 2014-08-10 DIAGNOSIS — D649 Anemia, unspecified: Secondary | ICD-10-CM

## 2014-08-10 DIAGNOSIS — C787 Secondary malignant neoplasm of liver and intrahepatic bile duct: Secondary | ICD-10-CM | POA: Diagnosis not present

## 2014-08-10 DIAGNOSIS — C801 Malignant (primary) neoplasm, unspecified: Secondary | ICD-10-CM | POA: Diagnosis not present

## 2014-08-10 DIAGNOSIS — N289 Disorder of kidney and ureter, unspecified: Secondary | ICD-10-CM

## 2014-08-10 DIAGNOSIS — C7951 Secondary malignant neoplasm of bone: Secondary | ICD-10-CM | POA: Diagnosis not present

## 2014-08-10 NOTE — Telephone Encounter (Signed)
Hospice RN, Brandon Melnick left voice mail wanting to discuss patient's plan of care with Dr Gearldine Shown nurse. Pt was seen by Ned Card, NP today. Message given to Dr Gearldine Shown nurse.

## 2014-08-10 NOTE — Progress Notes (Addendum)
  [facility] OFFICE PROGRESS NOTE   Diagnosis:  Metastatic carcinoma  INTERVAL HISTORY:   [name] returns as scheduled. His family reports he is weaker. New today he reported dizziness to them. His daughter also feels he is more lethargic. He began Remeron last night and took a Xanax this morning. He has a mild cough at bedtime. No fever. Stable dyspnea on exertion. Bowels are moving. No nausea or vomiting. He continues to have frequent urination at bedtime. He denies pain. Overall appetite is good.  Objective:  Vital signs in last 24 hours:  Blood pressure 104/48, pulse 85, temperature 97.5 F (36.4 C), temperature source Oral, resp. rate 18, height 5\' 11"  (1.803 m), weight 0 lb (0 kg), SpO2 100 %.    HEENT: No thrush or ulcers. Lymphatics: No palpable cervical or supra-clavicular lymph nodes. Resp: Lungs clear bilaterally. Cardio: Regular rate and rhythm. GI: Abdomen soft and nontender. Left lower quadrant colostomy. Vascular: No leg edema. Neuro: He is lethargic. Arouses easily.    Lab Results:  Lab Results  Component Value Date   WBC 12.4* 05/30/2014   HGB 9.2* 05/30/2014   HCT 27.4* 05/30/2014   MCV 97.2 05/30/2014   PLT 358 05/30/2014    Imaging:  No results found.  Medications: I have reviewed the patient's current medications.  Assessment/Plan: 1. Metastatic poorly different data carcinoma-unknown primary, likely cholangiocarcinoma  Staging CTs, MRI abdomen, and PET scan confirmed extensive left liver metastases, upper abdominal lymphadenopathy, and bone metastases 2. Anorexia/weight loss secondary to #1-now maintained on prednisone/Megace  3. Nonocclusive left portal vein thrombosis  4. Anemia-likely secondary to renal insufficiency, bone metastases, and chronic disease  5. Renal insufficiency  6. History of prostate cancer treated with radiation seed implantation  7. Rectal ulcer secondary to radiation seed implants,  status post a colostomy in 2002  8. History of skin cancers including melanoma and basal cell carcinoma   Disposition: [name] performance status continues to decline. He is enrolled in the [facility] hospice program.   The dizziness and lethargy today may be related to a combination of Remeron and Xanax. We will reduce the dose of Remeron if symptoms persist.  We recommended he contact Dr. Rex Kras regarding the frequent urination at nighttime.  He will return for a follow-up visit in 4 weeks. He will contact the office in the interim with any problems.  Patient seen with Dr. Benay Spice.    Ned Card ANP/GNP-BC   08/10/2014  11:36 AM  This was a shared visit with Ned Card. [name] appears to be declining. I explained to his family this is the expected clinical course with his metastatic carcinoma. The goal is comfort care. He will contact Dr. Rex Kras for recommendations to treat the nocturia.  Julieanne Manson, M.D.

## 2014-08-10 NOTE — Telephone Encounter (Signed)
Call from [facility] with hospice requesting an update on pt. Update provided per office note. Informed her Remeron dose may need to be decreased if dizziness and lethargy persist.

## 2014-08-10 NOTE — Telephone Encounter (Signed)
Gave avs & calendar for April. °

## 2014-08-17 ENCOUNTER — Telehealth: Payer: Self-pay | Admitting: *Deleted

## 2014-08-17 DIAGNOSIS — C801 Malignant (primary) neoplasm, unspecified: Secondary | ICD-10-CM

## 2014-08-17 MED ORDER — HALOPERIDOL LACTATE 2 MG/ML PO CONC: ORAL | Status: DC

## 2014-08-17 MED ORDER — HALOPERIDOL 2 MG PO TABS: ORAL_TABLET | ORAL | Status: DC

## 2014-08-17 NOTE — Telephone Encounter (Signed)
Brandon Melnick RN called status change for this patient.  Declining rapidly, too weak to stand, overnight restlessness and agitation.  Emotional distress noted and the Xanax, melatonin not working.  Family stopped the Mirtazapine stating it didn't work.  He is up all night having nightmares.  Family is in shock and traumatized at how rapidly he is progressing.  Would like to know what Dr. Benay Spice recommends.  Return number [phone].      1218 return call from [facility] who has received orders from Dr. Lyman Speller has ordered to discontinue Xanax, Melatonin and Remeron.  Order for Haldol 2 mg po at HS and every 4 hours for restlessness or agitation.  Will try to obtain liquid haldol but if not available will use pills.

## 2014-08-23 ENCOUNTER — Telehealth: Payer: Self-pay | Admitting: *Deleted

## 2014-08-23 NOTE — Telephone Encounter (Signed)
Jordan Melnick, RN with [facility] called to say that Dr. Karie Georges made a visit to the patient this morning for symptom management.  He has recommended and prescribed Zoloft 25 mg Q AM and Seraquel XR 50 mg each evening for situational depression.  Dr. Karie Georges will share a copy of his visit note with Dr. Benay Spice.

## 2014-08-24 ENCOUNTER — Telehealth: Payer: Self-pay | Admitting: *Deleted

## 2014-08-24 DIAGNOSIS — C801 Malignant (primary) neoplasm, unspecified: Secondary | ICD-10-CM

## 2014-08-24 NOTE — Telephone Encounter (Signed)
Pt's daughter in law stopped by to report pt is still unable to sleep, requesting medication changes for relief. No release in chart allowing me to discuss pt's care with [employer] Called on call RN for hospice, Leafy Ro. She reports she has spoken with the family today. Sleep aid was changed on 3/24. Medications reviewed, Mandy instructed them to try Oxycodone when pt goes to bed to see if this helps him rest. Per Leafy Ro, family seemed agreeable to trying this. Called pt's daughter, Precious Bard to follow up on pt's status. She reports hospice has been helpful, they have what they need at this time. Encouraged her to call office or hospice as needs arise. She voiced appreciation for call.

## 2014-08-27 ENCOUNTER — Other Ambulatory Visit: Payer: Self-pay | Admitting: Oncology

## 2014-08-28 ENCOUNTER — Telehealth: Payer: Self-pay | Admitting: *Deleted

## 2014-08-28 NOTE — Telephone Encounter (Signed)
Maura, RN [facility] reports that pt has decreased appetite/weakness, difficulty swallowing; on assessment noticed dime size red open ulcer back of throat, slight white patch on right side of tongue (just one spot) would Dr. Benay Spice like hospice to handle.  Notified Abigail Butts that Dr. Benay Spice agreeable for Hospice to handle/ Magic Mouthwash was prescribed for pt.   Abigail Butts also reports that hospice MD stopped Megace due to pt decease mobility/increased for blood clots.  Dr. Benay Spice made aware of medication changes.

## 2014-09-04 ENCOUNTER — Telehealth: Payer: Self-pay | Admitting: *Deleted

## 2014-09-05 ENCOUNTER — Telehealth: Payer: Self-pay | Admitting: *Deleted

## 2014-09-05 NOTE — Telephone Encounter (Signed)
Corell from [facility] called and left message that patient expired on 09-19-2014 at 6:52pm.  Will let Dr. Benay Spice and our HIM office know.

## 2014-09-06 ENCOUNTER — Telehealth: Payer: Self-pay | Admitting: Oncology

## 2014-09-06 NOTE — Telephone Encounter (Signed)
Received death certificate from funeral home. Received sign death certificate back from doctor. Faxed death certificate to [facility]. Called funeral home to pick up death certificate.

## 2014-09-07 ENCOUNTER — Ambulatory Visit: Payer: Commercial Managed Care - HMO | Admitting: Oncology

## 2014-09-30 NOTE — Telephone Encounter (Signed)
Hospice Nurse called to inform MD of patient death. Sep 18, 2014. Message sent to MD and RN

## 2014-09-30 DEATH — deceased

## 2016-03-07 IMAGING — MR MR ABDOMEN WO/W CM
13 of 17 series · 31 of 48 positions shown · IV contrast (multihance)
Comparison: CT 05/16/2014.

ADDENDUM:
Further clinical history is available. Despite the clinical history
submitted by the technologist, the patient does not have a history
of colon carcinoma. The only primary malignancy is prostate
carcinoma. Therefore, the potential etiology of the left hepatic
lobe lesions with intrahepatic ductal dilatation include
cholangiocarcinoma with intrahepatic metastasis or multi focal
metastasis from an unknown primary. Although this is not the typical
appearance of prostate carcinoma metastasis, this cannot be
excluded. Tissue sampling, likely via ultrasound guidance should be
considered.
CLINICAL DATA: Abnormal left lobe of the liver on CT. History of
colon cancer with colostomy. Melanoma and basal cell skin cancer.
[id number]: R 93.8.

EXAM:
MRI ABDOMEN WITHOUT AND WITH CONTRAST
TECHNIQUE: Multiplanar multisequence MR imaging of the abdomen was performed
both before and after the administration of intravenous contrast.
CONTRAST:  17mL MULTIHANCE GADOBENATE DIMEGLUMINE 529 MG/ML IV SOLN

[Series 3: T2 · coronal · 5.5mm · 1.56mm/px · 1 of 30 slices shown (1 of 3)]
[im 1/30]
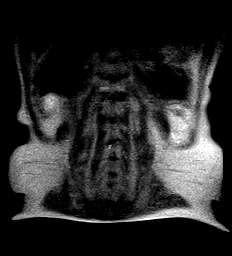

[Series 4: axial tru fisp · axial · 4.5mm · 1.48mm/px · 1 of 40 slices shown]
[im 1/40]
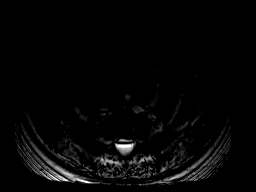

[Series 5: T2 · axial · 6.5mm · 0.74mm/px · 1 of 31 slices shown (2 of 3)]
[im 1/31]
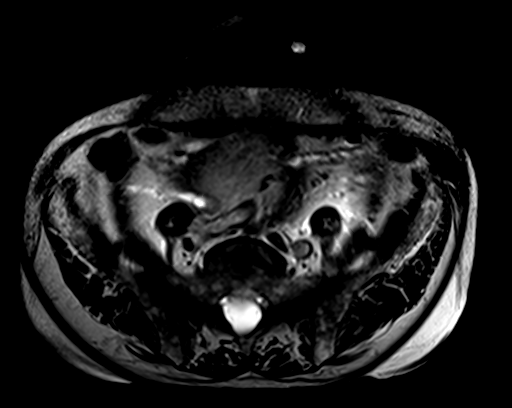

[Series 7: [id number] · axial · 6.0mm · 1.98mm/px · z∈[-140,+103]mm · 3 of 93 slices shown]
[im 1/93]
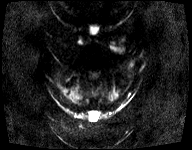
[im 47/93]
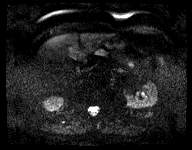
[im 93/93]
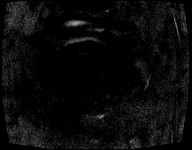

[Series 8: [id number]_adc · axial · 6.0mm · 1.98mm/px · 1 of 31 slices shown]
[im 1/31]
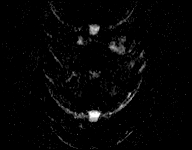

[Series 9: T2 · axial · 4.9mm · 1.48mm/px · 1 of 40 slices shown (3 of 3)]
[im 1/40]
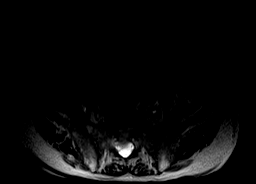

[Series 10: axial in out · axial · 6.0mm · 0.74mm/px · z∈[-160,+80]mm · 2 of 66 slices shown]
[im 1/66]
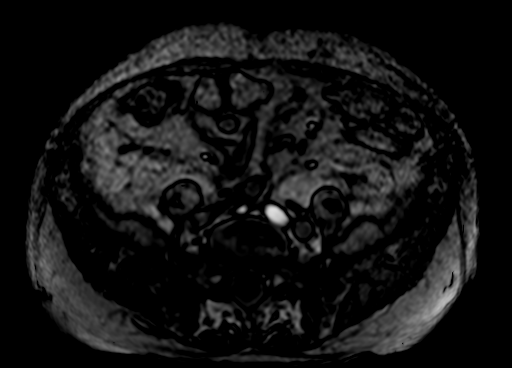
[im 66/66]
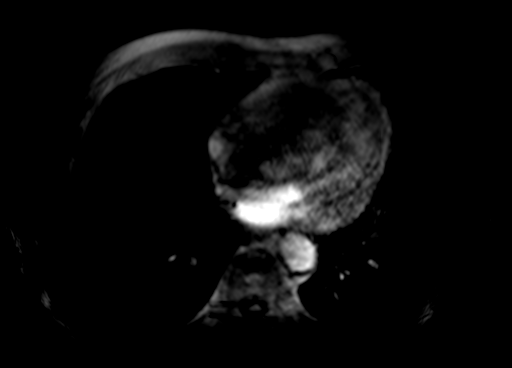

[Series 11: T1 dynamic · axial · non-contrast · 2.0mm · 0.78mm/px · z∈[-157,+81]mm · 4 of 120 slices shown]
[im 1/120]
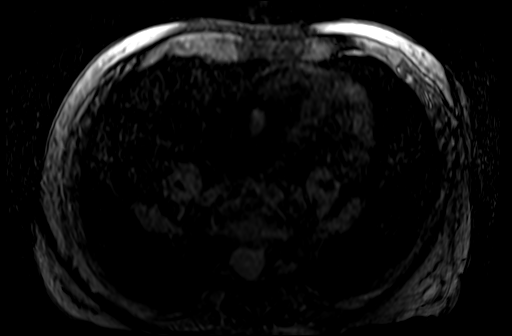
[im 40/120]
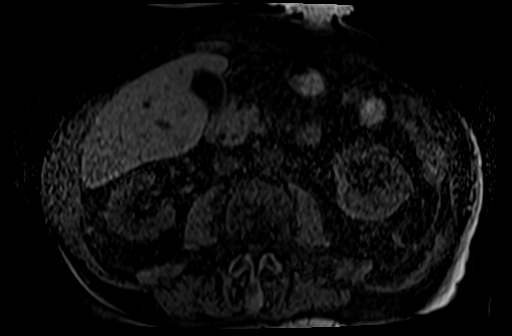
[im 80/120]
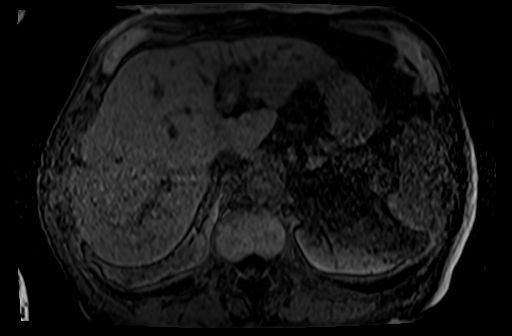
[im 120/120]
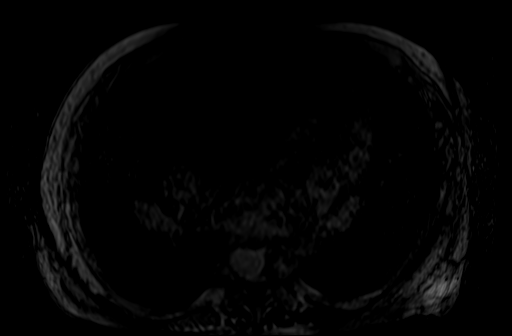

[Series 12: post 25 sec · axial · 2.0mm · 0.78mm/px · z∈[-157,+81]mm · 4 of 120 slices shown]
[im 1/120]
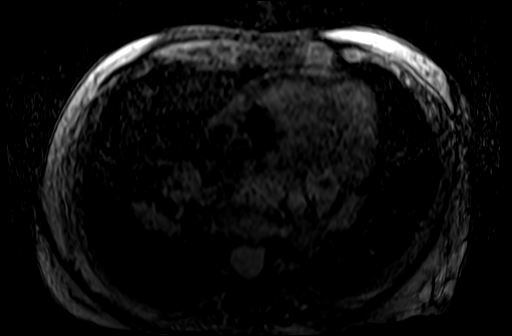
[im 40/120]
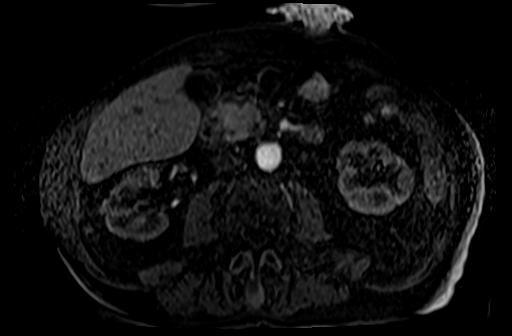
[im 80/120]
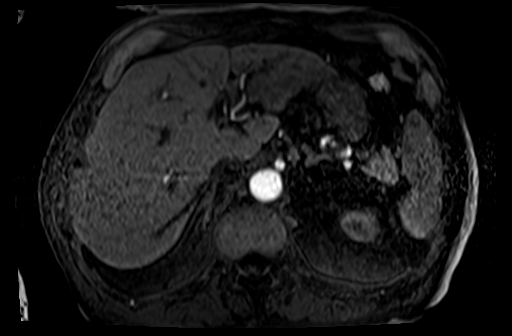
[im 120/120]
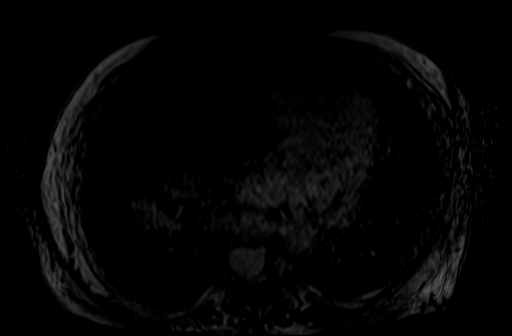

[Series 13: post 25 sec_sub · axial · 2.0mm · 0.78mm/px · z∈[-157,+81]mm · 4 of 120 slices shown]
[im 1/120]
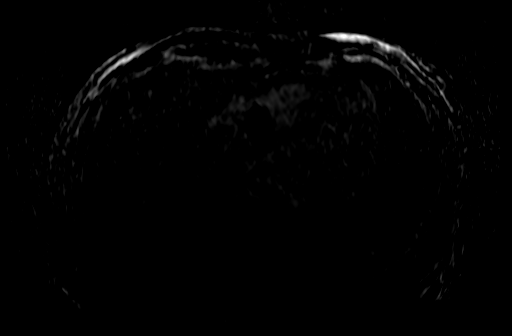
[im 40/120]
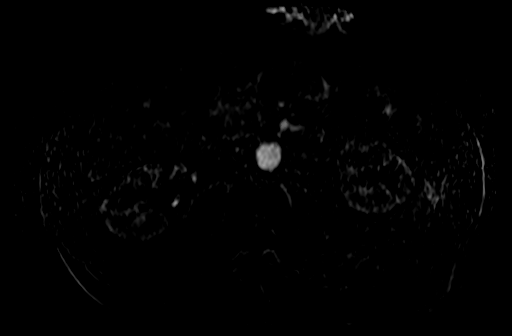
[im 80/120]
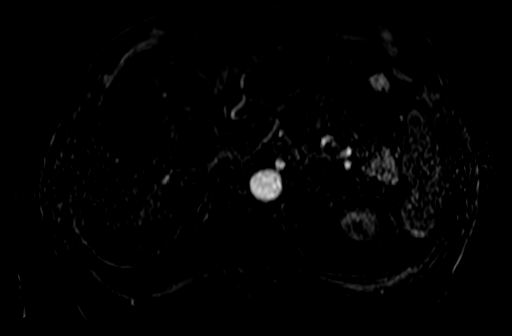
[im 120/120]
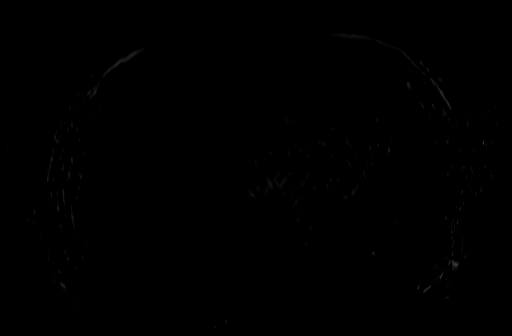

[Series 14: post 45 sec · axial · 2.0mm · 0.78mm/px · z∈[-157,+81]mm · 4 of 120 slices shown]
[im 1/120]
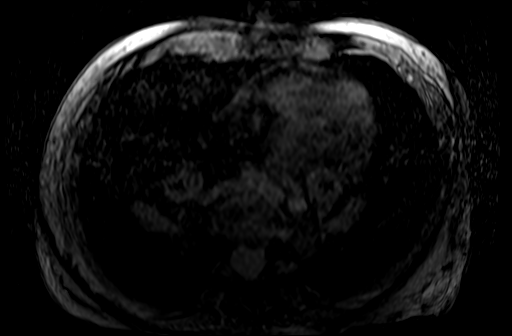
[im 40/120]
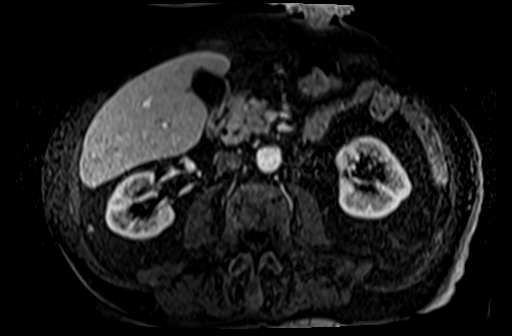
[im 80/120]
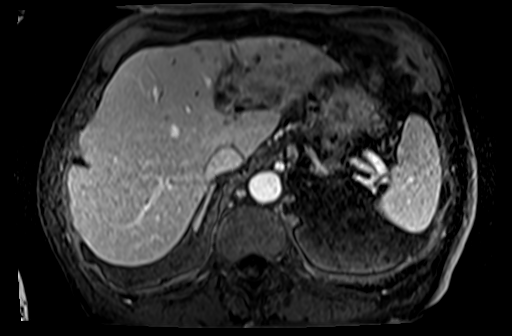
[im 120/120]
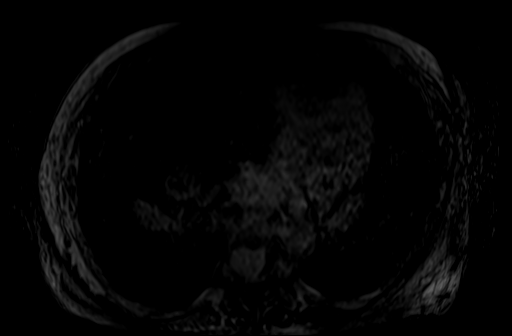

[Series 15: post 45 sec_sub · axial · 2.0mm · 0.78mm/px · z∈[-157,+81]mm · 4 of 120 slices shown]
[im 1/120]
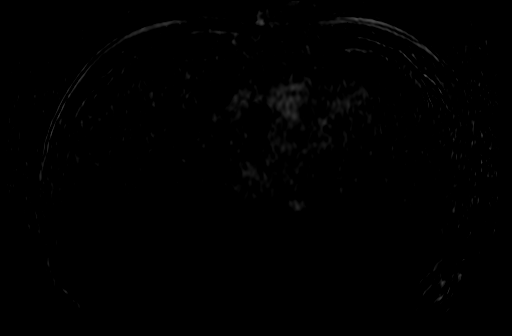
[im 40/120]
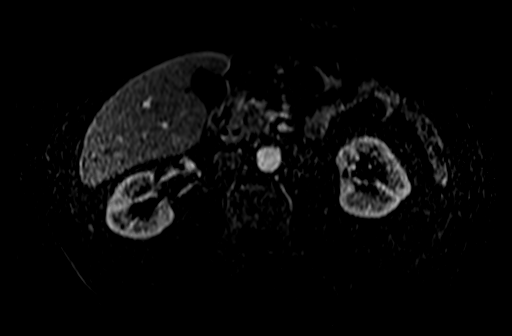
[im 80/120]
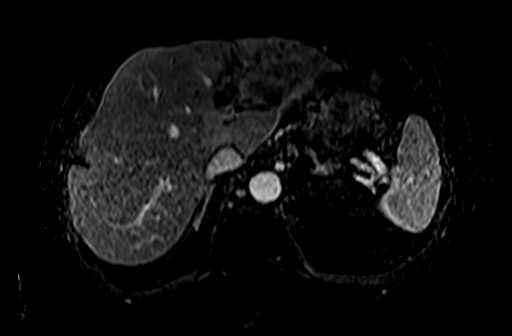
[im 120/120]
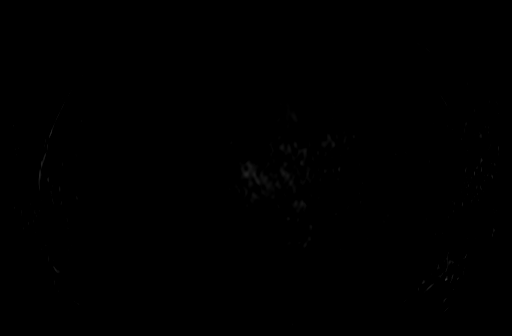

[Series 16: post 90 sec · axial · 2.0mm · 0.78mm/px · 1 of 120 slices shown]
[im 1/120]
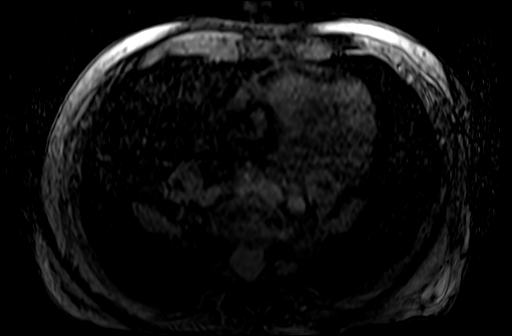

[31 of 48 positions shown; findings below may reference images not displayed]

FINDINGS: Lower chest: Normal heart size without pericardial or pleural
effusion.

Hepatobiliary: Hepatomegaly, greater than 20 cm craniocaudal. No
convincing evidence of cirrhosis.

Multiple hypoenhancing lesions within the left lobe of the liver,
highly suspicious for metastatic disease. Best evaluated on series
16. Example at 1.3 cm in the posterior aspect of the medial segment
left lobe. Example image 52. More anteriorly in the medial segment
left lobe a 1.0 cm on image 32. Concurrent mild intrahepatic ductal
dilatation, primarily in the lateral segment left liver lobe. Focal
ductal dilatation more centrally in the left lobe of the liver,
including on image 37 of series 16. This is presumably secondary to
obstruction by 1 or more liver lesions. Nonocclusive thrombus in the
left portal vein on image 41 of series 16.

Normal gallbladder, without biliary ductal dilatation.

Pancreas: Simple appearing cystic lesion within the pancreatic body.
1.0 cm on image 14 of series 5. No post-contrast enhancement. No
main pancreatic duct dilatation or evidence of acute pancreatitis.

Spleen: Normal

Adrenals/Urinary Tract: Normal adrenal glands. Left renal sinus
cysts. Tiny interpolar right renal cyst.

Stomach/Bowel: Underdistended proximal stomach. Normal small bowel
caliber. Incompletely imaged left-sided ostomy. Right paracentral
fat containing ventral hernia.

Vascular/Lymphatic: Aortic atherosclerosis. 1.4 cm a mildly enlarged
porta hepatis node on image 59 of series 16.

Other:  No ascites.

Musculoskeletal: Mild convex left lumbar spine curvature.
IMPRESSION: 1. Multiple left hepatic lobe lesions, highly suspicious for
metastatic disease. Morphology favors colonic Primary.
2. Mild intrahepatic ductal dilatation, likely secondary to
obstruction by 1 of the metastatic lesions. The common duct is
normal in caliber.
3. Nonocclusive thrombus in the left portal vein.
4. Suspicious mild porta hepatis adenopathy.
5. Simple appearing cystic lesion within the pancreatic body. Most
likely a pseudocyst. Indolent cystic neoplasm could look similar.
Per consensus criteria, this should be re-evaluated with followup
pancreatic protocol CT or MRI in 1 year. However, given patient age
and comorbidities, this may not be necessary. This recommendation
follows ACR consensus guidelines: Managing Incidental Findings on
Abdominal CT: White Paper of the ACR Incidental Findings Committee.
[HOSPITAL] [id number];[DATE]
